# Patient Record
Sex: Male | Born: 1945
Health system: Southern US, Community
[De-identification: ages and names within clinical notes are randomized; demographics above are authoritative.]

## PROBLEM LIST (undated history)

## (undated) DIAGNOSIS — L814 Other melanin hyperpigmentation: Secondary | ICD-10-CM

## (undated) DIAGNOSIS — R51 Headache: Secondary | ICD-10-CM

## (undated) DIAGNOSIS — D126 Benign neoplasm of colon, unspecified: Secondary | ICD-10-CM

## (undated) DIAGNOSIS — K5730 Diverticulosis of large intestine without perforation or abscess without bleeding: Secondary | ICD-10-CM

## (undated) DIAGNOSIS — F341 Dysthymic disorder: Secondary | ICD-10-CM

## (undated) DIAGNOSIS — I10 Essential (primary) hypertension: Secondary | ICD-10-CM

## (undated) DIAGNOSIS — K635 Polyp of colon: Secondary | ICD-10-CM

## (undated) DIAGNOSIS — J40 Bronchitis, not specified as acute or chronic: Secondary | ICD-10-CM

## (undated) DIAGNOSIS — J329 Chronic sinusitis, unspecified: Secondary | ICD-10-CM

## (undated) DIAGNOSIS — R9431 Abnormal electrocardiogram [ECG] [EKG]: Secondary | ICD-10-CM

## (undated) HISTORY — DX: Other melanin hyperpigmentation: L81.4

## (undated) HISTORY — DX: Bronchitis, not specified as acute or chronic: J40

## (undated) HISTORY — DX: Diverticulosis of large intestine without perforation or abscess without bleeding: K57.30

## (undated) HISTORY — DX: Essential (primary) hypertension: I10

## (undated) HISTORY — DX: Headache: R51

## (undated) HISTORY — DX: Polyp of colon: K63.5

## (undated) HISTORY — DX: Dysthymic disorder: F34.1

## (undated) HISTORY — DX: Chronic sinusitis, unspecified: J32.9

## (undated) HISTORY — PX: OTHER SURGICAL HISTORY: SHX169

## (undated) HISTORY — DX: Abnormal electrocardiogram (ECG) (EKG): R94.31

## (undated) HISTORY — DX: Benign neoplasm of colon, unspecified: D12.6

---

## 1999-04-30 ENCOUNTER — Other Ambulatory Visit: Admission: RE | Admit: 1999-04-30 | Discharge: 1999-04-30 | Payer: Self-pay | Admitting: Gastroenterology

## 1999-04-30 ENCOUNTER — Encounter (INDEPENDENT_AMBULATORY_CARE_PROVIDER_SITE_OTHER): Payer: Self-pay

## 2002-07-15 ENCOUNTER — Ambulatory Visit (HOSPITAL_COMMUNITY): Admission: RE | Admit: 2002-07-15 | Discharge: 2002-07-15 | Payer: Self-pay | Admitting: *Deleted

## 2005-07-02 ENCOUNTER — Ambulatory Visit: Payer: Self-pay | Admitting: Pulmonary Disease

## 2005-12-18 ENCOUNTER — Ambulatory Visit: Payer: Self-pay | Admitting: Pulmonary Disease

## 2007-01-22 ENCOUNTER — Ambulatory Visit: Payer: Self-pay | Admitting: Pulmonary Disease

## 2007-01-22 LAB — CONVERTED CEMR LAB
ALT: 25 units/L (ref 0–40)
AST: 23 units/L (ref 0–37)
BUN: 23 mg/dL (ref 6–23)
Basophils Relative: 0.6 % (ref 0.0–1.0)
Bilirubin, Direct: 0.1 mg/dL (ref 0.0–0.3)
Calcium: 9.2 mg/dL (ref 8.4–10.5)
Chloride: 109 meq/L (ref 96–112)
Cholesterol: 173 mg/dL (ref 0–200)
Creatinine, Ser: 1 mg/dL (ref 0.4–1.5)
Eosinophils Absolute: 0.2 10*3/uL (ref 0.0–0.6)
GFR calc Af Amer: 99 mL/min
GFR calc non Af Amer: 82 mL/min
HDL: 42.3 mg/dL (ref >39.0)
Hemoglobin: 14.6 g/dL (ref 13.0–17.0)
Ketones, ur: NEGATIVE mg/dL
MCV: 89.6 fL (ref 78.0–100.0)
Monocytes Relative: 8.5 % (ref 3.0–11.0)
Nitrite: NEGATIVE
PSA: 1.22 ng/mL (ref 0.10–4.00)
Potassium: 4.2 meq/L (ref 3.5–5.1)
Specific Gravity, Urine: 1.015 (ref 1.000–1.03)
TSH: 1.1 microintl units/mL (ref 0.35–5.50)
Total CHOL/HDL Ratio: 4.1
Triglycerides: 107 mg/dL (ref 0–149)
Urobilinogen, UA: 0.2 (ref 0.0–1.0)
WBC: 5.2 10*3/uL (ref 4.5–10.5)
pH: 7 (ref 5.0–8.0)

## 2008-04-27 DIAGNOSIS — F341 Dysthymic disorder: Secondary | ICD-10-CM | POA: Insufficient documentation

## 2008-04-27 DIAGNOSIS — J329 Chronic sinusitis, unspecified: Secondary | ICD-10-CM | POA: Insufficient documentation

## 2008-04-27 DIAGNOSIS — R51 Headache: Secondary | ICD-10-CM | POA: Insufficient documentation

## 2008-04-27 DIAGNOSIS — J40 Bronchitis, not specified as acute or chronic: Secondary | ICD-10-CM | POA: Insufficient documentation

## 2008-04-27 DIAGNOSIS — R519 Headache, unspecified: Secondary | ICD-10-CM | POA: Insufficient documentation

## 2008-04-28 ENCOUNTER — Ambulatory Visit: Payer: Self-pay | Admitting: Pulmonary Disease

## 2008-04-28 DIAGNOSIS — J309 Allergic rhinitis, unspecified: Secondary | ICD-10-CM | POA: Insufficient documentation

## 2008-04-28 DIAGNOSIS — R9431 Abnormal electrocardiogram [ECG] [EKG]: Secondary | ICD-10-CM | POA: Insufficient documentation

## 2008-04-28 DIAGNOSIS — L819 Disorder of pigmentation, unspecified: Secondary | ICD-10-CM | POA: Insufficient documentation

## 2008-04-28 DIAGNOSIS — D126 Benign neoplasm of colon, unspecified: Secondary | ICD-10-CM | POA: Insufficient documentation

## 2008-04-28 DIAGNOSIS — K5730 Diverticulosis of large intestine without perforation or abscess without bleeding: Secondary | ICD-10-CM | POA: Insufficient documentation

## 2008-04-28 DIAGNOSIS — E785 Hyperlipidemia, unspecified: Secondary | ICD-10-CM | POA: Insufficient documentation

## 2008-04-28 LAB — CONVERTED CEMR LAB
ALT: 35 units/L (ref 0–53)
AST: 28 units/L (ref 0–37)
Albumin: 4.4 g/dL (ref 3.5–5.2)
Alkaline Phosphatase: 50 units/L (ref 39–117)
BUN: 20 mg/dL (ref 6–23)
Bacteria, UA: NEGATIVE
Basophils Absolute: 0 10*3/uL (ref 0.0–0.1)
Basophils Relative: 0.7 % (ref 0.0–3.0)
Bilirubin Urine: NEGATIVE
Bilirubin, Direct: 0.1 mg/dL (ref 0.0–0.3)
CO2: 30 meq/L (ref 19–32)
Calcium: 9.4 mg/dL (ref 8.4–10.5)
Chloride: 101 meq/L (ref 96–112)
Cholesterol: 193 mg/dL (ref 0–200)
Creatinine, Ser: 0.8 mg/dL (ref 0.4–1.5)
Crystals: NEGATIVE
Eosinophils Absolute: 0.2 10*3/uL (ref 0.0–0.7)
Eosinophils Relative: 4 % (ref 0.0–5.0)
GFR calc Af Amer: 127 mL/min
GFR calc non Af Amer: 105 mL/min
Glucose, Bld: 115 mg/dL — ABNORMAL HIGH (ref 70–99)
HCT: 46.6 % (ref 39.0–52.0)
HDL: 48.4 mg/dL (ref >39.0)
Hemoglobin, Urine: NEGATIVE
Hemoglobin: 16.1 g/dL (ref 13.0–17.0)
Ketones, ur: NEGATIVE mg/dL
LDL Cholesterol: 124 mg/dL — ABNORMAL HIGH (ref 0–99)
Leukocytes, UA: NEGATIVE
Lymphocytes Relative: 45.5 % (ref 12.0–46.0)
MCHC: 34.6 g/dL (ref 30.0–36.0)
MCV: 92.2 fL (ref 78.0–100.0)
Monocytes Absolute: 0.4 10*3/uL (ref 0.1–1.0)
Monocytes Relative: 7.5 % (ref 3.0–12.0)
Mucus, UA: NEGATIVE
Neutro Abs: 2.3 10*3/uL (ref 1.4–7.7)
Neutrophils Relative %: 42.3 % — ABNORMAL LOW (ref 43.0–77.0)
Nitrite: NEGATIVE
PSA: 1.5 ng/mL (ref 0.10–4.00)
Platelets: 227 10*3/uL (ref 150–400)
Potassium: 4.5 meq/L (ref 3.5–5.1)
RBC / HPF: NONE SEEN
RBC: 5.06 M/uL (ref 4.22–5.81)
RDW: 12.4 % (ref 11.5–14.6)
Sodium: 137 meq/L (ref 135–145)
Specific Gravity, Urine: 1.01 (ref 1.000–1.03)
Squamous Epithelial / LPF: NEGATIVE /lpf
TSH: 1.13 microintl units/mL (ref 0.35–5.50)
Total Bilirubin: 1 mg/dL (ref 0.3–1.2)
Total CHOL/HDL Ratio: 4
Total Protein, Urine: NEGATIVE mg/dL
Total Protein: 6.8 g/dL (ref 6.0–8.3)
Triglycerides: 105 mg/dL (ref 0–149)
Urine Glucose: NEGATIVE mg/dL
Urobilinogen, UA: 0.2 (ref 0.0–1.0)
VLDL: 21 mg/dL (ref 0–40)
WBC, UA: NONE SEEN cells/hpf
WBC: 5.5 10*3/uL (ref 4.5–10.5)
pH: 7.5 (ref 5.0–8.0)

## 2008-05-12 ENCOUNTER — Ambulatory Visit (HOSPITAL_COMMUNITY): Admission: RE | Admit: 2008-05-12 | Discharge: 2008-05-12 | Payer: Self-pay | Admitting: *Deleted

## 2009-08-07 IMAGING — CR DG CHEST 2V
2 series · 2 of 2 positions shown · non-contrast
Comparison: PA and lateral chest 01/22/2007.

CLINICAL DATA: Wellness check.

CHEST - 2 VIEW

[view not recorded (1 of 2)]
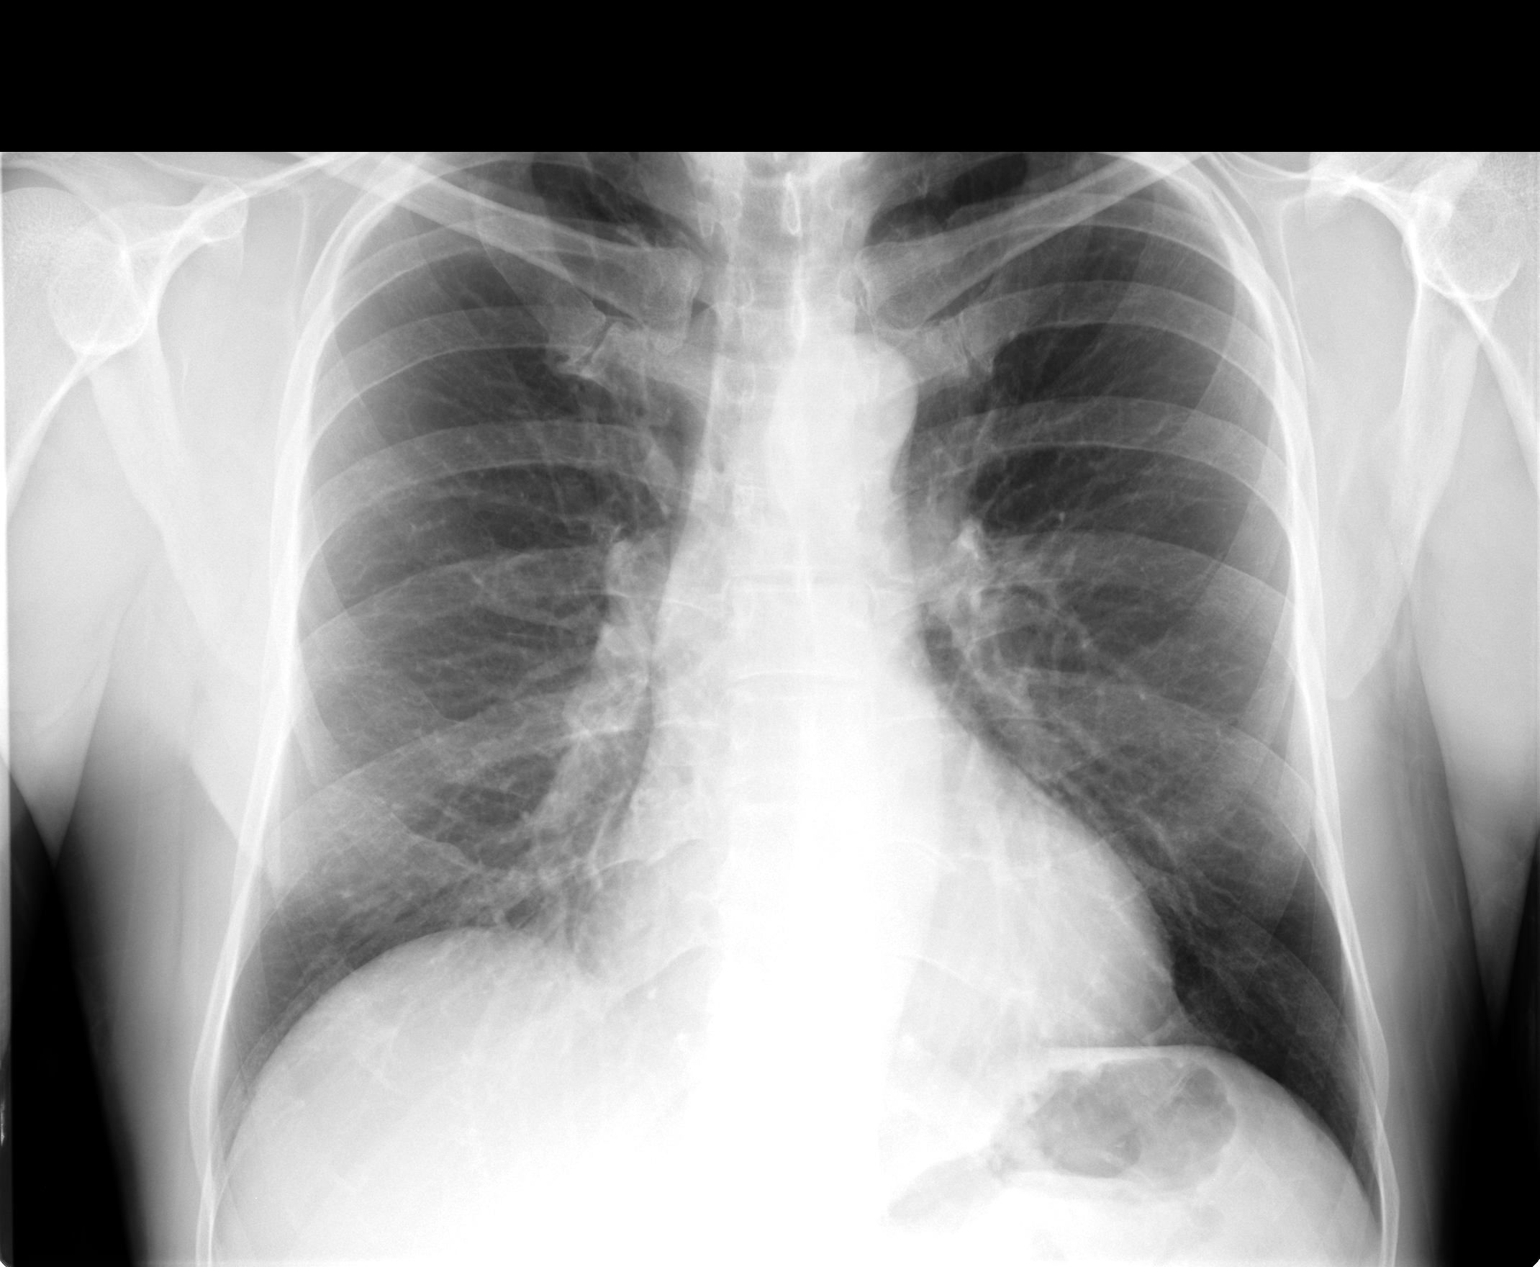

[view not recorded (2 of 2)]
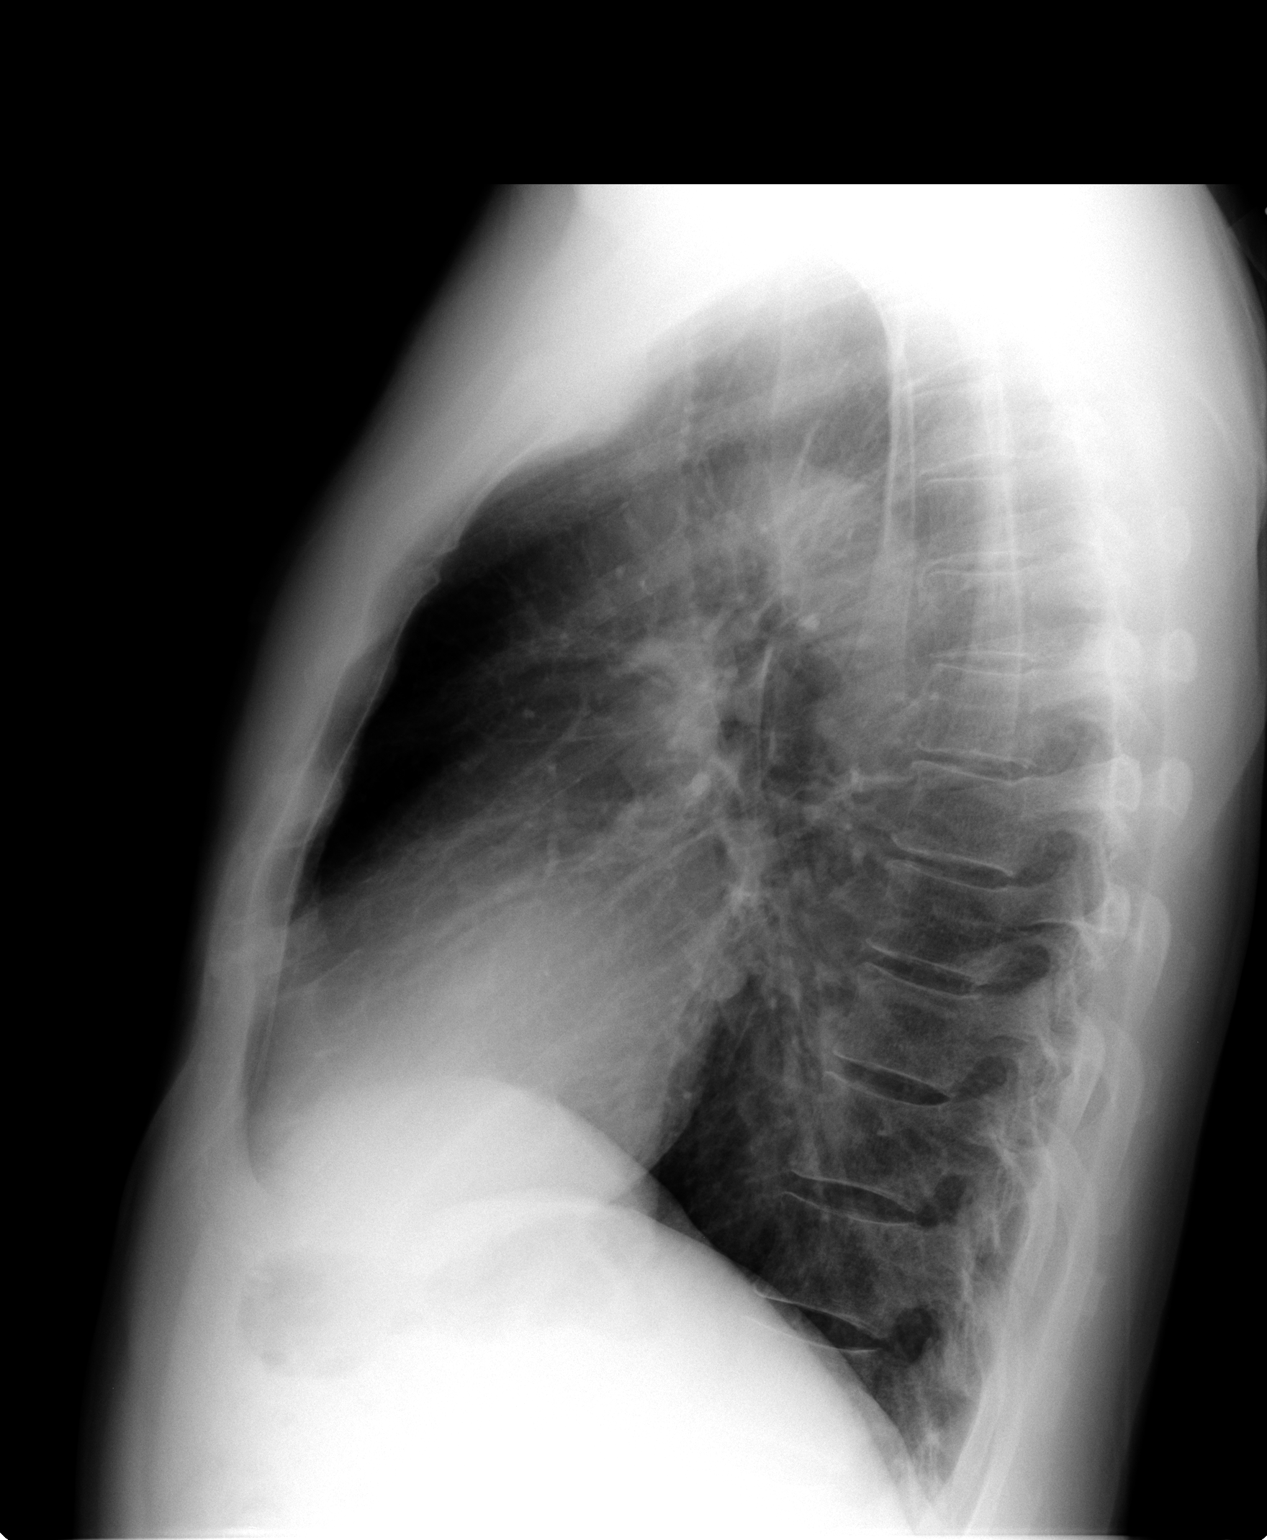

[2 of 2 positions shown; findings below may reference images not displayed]

FINDINGS: Lungs are clear.  Heart size is normal.  There is no
pleural effusion or focal bony abnormality.
IMPRESSION: No acute disease.

## 2009-12-08 ENCOUNTER — Ambulatory Visit: Payer: Self-pay | Admitting: Pulmonary Disease

## 2009-12-09 LAB — CONVERTED CEMR LAB
AST: 27 units/L (ref 0–37)
Alkaline Phosphatase: 53 units/L (ref 39–117)
BUN: 20 mg/dL (ref 6–23)
Basophils Relative: 0.8 % (ref 0.0–3.0)
Bilirubin Urine: NEGATIVE
CO2: 31 meq/L (ref 19–32)
Calcium: 9.6 mg/dL (ref 8.4–10.5)
Chloride: 107 meq/L (ref 96–112)
Creatinine, Ser: 0.8 mg/dL (ref 0.4–1.5)
Direct LDL: 125 mg/dL
Eosinophils Absolute: 0.1 10*3/uL (ref 0.0–0.7)
Eosinophils Relative: 2.5 % (ref 0.0–5.0)
HCT: 44.4 % (ref 39.0–52.0)
Hemoglobin, Urine: NEGATIVE
Leukocytes, UA: NEGATIVE
MCHC: 32.6 g/dL (ref 30.0–36.0)
MCV: 94.8 fL (ref 78.0–100.0)
Monocytes Relative: 8.6 % (ref 3.0–12.0)
Neutro Abs: 1.6 10*3/uL (ref 1.4–7.7)
Neutrophils Relative %: 35.4 % — ABNORMAL LOW (ref 43.0–77.0)
Nitrite: NEGATIVE
PSA: 2.23 ng/mL (ref 0.10–4.00)
RBC: 4.68 M/uL (ref 4.22–5.81)
Specific Gravity, Urine: 1.02 (ref 1.000–1.030)
Total CHOL/HDL Ratio: 3
Total Protein, Urine: NEGATIVE mg/dL
Total Protein: 7.8 g/dL (ref 6.0–8.3)
Urobilinogen, UA: 0.2 (ref 0.0–1.0)
VLDL: 10.8 mg/dL (ref 0.0–40.0)
WBC: 4.6 10*3/uL (ref 4.5–10.5)
pH: 7.5 (ref 5.0–8.0)

## 2010-09-06 ENCOUNTER — Ambulatory Visit: Payer: Self-pay | Admitting: Pulmonary Disease

## 2010-09-06 DIAGNOSIS — I10 Essential (primary) hypertension: Secondary | ICD-10-CM | POA: Insufficient documentation

## 2010-10-30 NOTE — Assessment & Plan Note (Signed)
Summary: Acute NP office visit - hypertension   CC:  HBP "off and on" x1year w/ "mild" HA.  denies changes in vision.  History of Present Illness: 65 y    ~  Jul09:  he enjoys excellent general medical health and runs marathons- 3 this year... he is on no regular meds other than an Aspirin daily... his only complaint is an unusual episodic "wooziness" that occurs while [place] 70[place] the interstate- assoc w/ a globus-like sensation in his throat... drinking some water may help the sensation, and if he stops and lets his wife drive it all resolves... I offered him some Klonopin to try for these symptoms but he is reluctant to start new meds so he will just watch the symptoms and call Prn...   ~  December 08, 2009:  he remains in excellent medical health- still running regularly & working out at [employer]... no intercurrent medical problems, feeling well w/o new complaints or concerns...  he requests refill Levaquin for the occas sinus infection, and is due for a TDaP vaccination today...  September 06, 2010 --Presents for work in visit. Complains that blood pressure has been trending up. On average running 140-150/90-100. He is very active and runs marathons. Does take NSAIDS on average 4 tabs a week. Has a  hx of HTN in parents and sister.  Denies chest pain, dyspnea, orthopnea, hemoptysis, fever, n/v/d, edema, headache,recent travel , visual/speech changes,  Denies any decongestants.   Medications Prior to Update: 1)  Aspirin Adult Low Strength 81 Mg  Tbec (Aspirin) .... Take 1 By Mouth Once Daily 2)  Levaquin 500 Mg Tabs (Levofloxacin) .... Take 1 Tab By Mouth Once Daily...  Current Medications (verified): 1)  Aspirin Adult Low Strength 81 Mg  Tbec (Aspirin) .... Take 1 By Mouth Once Daily  Allergies (verified): No Known Drug Allergies  Past History:  Past Medical History: Last updated: 12/08/2009  ALLERGIC RHINITIS (ICD-477.9) Hx of SINUSITIS (ICD-473.9) Hx of BRONCHITIS (ICD-490) Hx  of ABNORMAL ELECTROCARDIOGRAM (ICD-794.31) HYPERCHOLESTEROLEMIA, BORDERLINE (ICD-272.4) DIVERTICULOSIS OF COLON (ICD-562.10) COLONIC POLYPS (ICD-211.3) Hx of HEADACHE (ICD-784.0) ANXIETY DEPRESSION (ICD-300.4) Hx of LENTIGO (ICD-709.09)  Family History: Last updated: 04/28/2008 mother alive age 85 father deceased age 18  hx of heart problems 1 sister alive age 16  Social History: Last updated: 09/06/2010 retired quit smoking in 1979 etoh--2 drinks per day married 1 child declines flu shot 09-06-10  Risk Factors: Smoking Status: quit (04/27/2008)  Social History: retired quit smoking in 1979 etoh--2 drinks per day married 1 child declines flu shot 09-06-10  Review of Systems      See HPI  Vital Signs:  Patient profile:   65 year old male Height:      69 inches Weight:      164 pounds BMI:     24.31 O2 Sat:      100 % on Room air Temp:     96.8 degrees F oral Pulse rate:   71 / minute BP sitting:   162 / 104  (right arm) Cuff size:   regular  Vitals Entered By: Boone Master CNA/MA (September 06, 2010 10:59 AM)  O2 Flow:  Room air CC: HBP "off and on" x1year w/ "mild" HA.  denies changes in vision Is Patient Diabetic? No Comments Medications reviewed with patient Daytime contact number verified with patient. Boone Master CNA/MA  September 06, 2010 10:59 AM    Physical Exam  Additional Exam:  WD, WN, 65 y/o WM  in NAD... GENERAL:  Alert & oriented; pleasant & cooperative... HEENT:  [place]/AT, EOM-wnl, PERRLA, Fundi-benign, EACs-clear, TMs-wnl, NOSE-clear, THROAT-clear & wnl. NECK:  Supple w/ full ROM; no JVD; normal carotid impulses w/o bruits; no thyromegaly or nodules palpated; no lymphadenopathy. CHEST:  Clear to P & A; without wheezes/ rales/ or rhonchi heard... HEART:  Regular Rhythm; without murmurs/ rubs/ or gallops detected... ABDOMEN:  Soft & nontender; normal bowel sounds; no organomegaly or masses palpated... EXT: without deformities or arthritic  changes; no varicose veins/ venous insuffic/ or edema... NEURO:  CN's intact; motor testing normal; sensory testing normal; gait normal & balance OK. DERM:  No lesions noted; no rash etc...    Impression & Recommendations:  Problem # 1:  HYPERTENSION, BENIGN ESSENTIAL (ICD-401.1)  Unable to control b/p with diet and exercise.  advised on decreasing NSAIDS use. and to avoid decongestants, pt verbalized understanding.  We discussed several options for b/p control, he would like to try norvasc-(wife is taking).  Advised on return visit in 6 weeks to follow up however he wants to wait until CPX in 3 months Dr. Kriste Basque  advised on b/p log and to call if b/p is not improving.  Plan:  Amlodipine 5mg  once daily  Low salt diet.  Exercise as tolerated  Check blood pressure 3 times week. Keep log and bring next office visit.  follow up Dr. Kriste Basque in 3 months for physical after 12/10/10.  Please contact office for sooner follow up if symptoms do not improve or worsen  Call blood pressure <90 or >160.   His updated medication list for this problem includes:    Amlodipine Besylate 5 Mg Tabs (Amlodipine besylate) .Marland Kitchen... 1 by mouth once daily  Orders: Est. Patient Level IV (25852)  Medications Added to Medication List This Visit: 1)  Amlodipine Besylate 5 Mg Tabs (Amlodipine besylate) .Marland Kitchen.. 1 by mouth once daily  Complete Medication List: 1)  Aspirin Adult Low Strength 81 Mg Tbec (Aspirin) .... Take 1 by mouth once daily 2)  Amlodipine Besylate 5 Mg Tabs (Amlodipine besylate) .Marland Kitchen.. 1 by mouth once daily  Patient Instructions: 1)  Amlodipine 5mg  once daily  2)  Low salt diet.  3)  Exercise as tolerated  4)  Check blood pressure 3 times week. Keep log and bring next office visit.  5)  follow up Dr. Kriste Basque in 3 months for physical after 12/10/10.  6)  Please contact office for sooner follow up if symptoms do not improve or worsen  7)  Call blood pressure <90 or >160.  Prescriptions: AMLODIPINE  BESYLATE 5 MG TABS (AMLODIPINE BESYLATE) 1 by mouth once daily  #30 x 5   Entered and Authorized by:   Rubye Oaks NP   Signed by:   Tammy Parrett NP on 09/06/2010   Method used:   Electronically to        [id number] (retail)       [address]       [place], [place]  [address]       Ph: [phone] or [phone]       Fax: [phone]   RxID:   [id number]

## 2010-10-30 NOTE — Assessment & Plan Note (Signed)
Summary: cpx/fasting/apc   CC:  20 month ROV & CPX....  History of Present Illness: 65 y/o WM here for a follow up visit and CPX...    ~  Jul09:  he enjoys excellent general medical health and runs marathons- 3 this year... he is on no regular meds other than an Aspirin daily... his only complaint is an unusual episodic "wooziness" that occurs while driving 70mph on the interstate- assoc w/ a globus-like sensation in his throat... drinking some water may help the sensation, and if he stops and lets his wife drive it all resolves... I offered him some Klonopin to try for these symptoms but he is reluctant to start new meds so he will just watch the symptoms and call Prn...   ~  December 08, 2009:  he remains in excellent medical health- still running regularly & working out at [employer]... no intercurrent medical problems, feeling well w/o new complaints or concerns...  he requests refill Levaquin for the occas sinus infection, and is due for a TDaP vaccination today...    Current Problem List:  ALLERGIC RHINITIS (ICD-477.9)  Hx of SINUSITIS (ICD-473.9) - Amoxiciilin doesn't work for him & he uses Levaquin.  Hx of BRONCHITIS (ICD-490) - he likes to keep Rx for Levaquin on hand for bronchitic infections...  ~  CXR 3/11 showed clear lungs, NAD...  Hx of ABNORMAL ELECTROCARDIOGRAM (ICD-794.31) - his baseline EKG's have shown benign early repolarization changes and increased voltage... no change over the last 42yrs... 2DEcho 1999 showed normal heart chambers & valves- trivial MR, EF=60%... as noted he exercises regularly & still runs marathons...  ~  EKG 3/11 showed NSR, rate 62/min, increased voltage and early repol, no acute changes... NOTE:  BP 138/88 after rest & he is recommended to check BP at home & call if >140s/ 90...  HYPERCHOLESTEROLEMIA, BORDERLINE (ICD-272.4) - on diet alone, normal weight, good athlete...  ~  FLP 4/08 showed TChol 173, TG 107, HDL 42, LDL 109  ~  FLP 7/09 showed  TChol 193, TG 105, HDL 48, LDL 124  ~  FLP 3/11 showed TChol 201, TG 54, HDL 69, LDL 125  DIVERTICULOSIS OF COLON (ICD-562.10) & COLONIC POLYPS (ICD-211.3) - he is friends w/ DrGOrr and colonoscopy 10/03 by him was neg x for sm hems... several 2-53mm polyps removed in 2000 by DrKaplan who also noted a few divertics... path= 3 hyperpl polyps & one tubular adenoma... f/u colon was done by DrOrr 8/09 before he moved to [facility] according to the pt- we don't have copy, pt reports that it was OK...   Hx of HEADACHE (ICD-784.0)  Hx of ANXIETY DEPRESSION (ICD-300.4) - prev treated w/ Paxil yrs ago... no recurrent problems...  Hx of LENTIGO (ICD-709.09) - Derm f/u DrTafeen...  Health Maintenance:  ~  GI:  colon per DrOrr 8/09 reported by the pt to be "OK"... f/u due 22yrs due to hx polyps.  ~  GU:  CPX 3/11 showed DRE= neg, & PSA= 2.23...  ~  Immunization:  he refuses Flu vaccines;  had PNEUMOVAX in 2000 at age 9;  had Tetanus shot 2000 & we will give TDaP today 12/08/09.    Allergies (verified): No Known Drug Allergies  Comments:  Nurse/Medical Assistant: The patient's medications and allergies were reviewed with the patient and were updated in the Medication and Allergy Lists.  Past History:  Past Medical History:  ALLERGIC RHINITIS (ICD-477.9) Hx of SINUSITIS (ICD-473.9) Hx of BRONCHITIS (ICD-490) Hx of ABNORMAL ELECTROCARDIOGRAM (ICD-794.31)  HYPERCHOLESTEROLEMIA, BORDERLINE (ICD-272.4) DIVERTICULOSIS OF COLON (ICD-562.10) COLONIC POLYPS (ICD-211.3) Hx of HEADACHE (ICD-784.0) ANXIETY DEPRESSION (ICD-300.4) Hx of LENTIGO (ICD-709.09)  Family History: Reviewed history from 04/28/2008 and no changes required. mother alive age 65 father deceased age 50  hx of heart problems 1 sister alive age 30  Social History: Reviewed history from 04/28/2008 and no changes required. retired quit smoking in 1979 etoh--2 drinks per day married 1 child  Review of Systems  The patient  denies fever, chills, sweats, anorexia, fatigue, weakness, malaise, weight loss, sleep disorder, blurring, diplopia, eye irritation, eye discharge, vision loss, eye pain, photophobia, earache, ear discharge, tinnitus, decreased hearing, nasal congestion, nosebleeds, sore throat, hoarseness, chest pain, palpitations, syncope, dyspnea on exertion, orthopnea, PND, peripheral edema, cough, dyspnea at rest, excessive sputum, hemoptysis, wheezing, pleurisy, nausea, vomiting, diarrhea, constipation, change in bowel habits, abdominal pain, melena, hematochezia, jaundice, gas/bloating, indigestion/heartburn, dysphagia, odynophagia, dysuria, hematuria, urinary frequency, urinary hesitancy, nocturia, incontinence, back pain, joint pain, joint swelling, muscle cramps, muscle weakness, stiffness, arthritis, sciatica, restless legs, leg pain at night, leg pain with exertion, rash, itching, dryness, suspicious lesions, paralysis, paresthesias, seizures, tremors, vertigo, transient blindness, frequent falls, frequent headaches, difficulty walking, depression, anxiety, memory loss, confusion, cold intolerance, heat intolerance, polydipsia, polyphagia, polyuria, unusual weight change, abnormal bruising, bleeding, enlarged lymph nodes, urticaria, allergic rash, hay fever, and recurrent infections.    Vital Signs:  Patient profile:   65 year old male Height:      69 inches Weight:      163.50 pounds BMI:     24.23 O2 Sat:      100 % on Room air Temp:     97.4 degrees F oral Pulse rate:   62 / minute BP sitting:   138 / 88  (right arm) Cuff size:   regular  Vitals Entered By: Randell Loop CMA (December 08, 2009 8:48 AM)  O2 Sat at Rest %:  100 O2 Flow:  Room air CC: 20 month ROV & CPX... Is Patient Diabetic? No Pain Assessment Patient in pain? no      Comments meds updated today   Physical Exam  Additional Exam:  WD, WN, 65 y/o WM in NAD... GENERAL:  Alert & oriented; pleasant & cooperative... HEENT:  [place]/AT,  EOM-wnl, PERRLA, Fundi-benign, EACs-clear, TMs-wnl, NOSE-clear, THROAT-clear & wnl. NECK:  Supple w/ full ROM; no JVD; normal carotid impulses w/o bruits; no thyromegaly or nodules palpated; no lymphadenopathy. CHEST:  Clear to P & A; without wheezes/ rales/ or rhonchi heard... HEART:  Regular Rhythm; without murmurs/ rubs/ or gallops detected... ABDOMEN:  Soft & nontender; normal bowel sounds; no organomegaly or masses palpated... EXT: without deformities or arthritic changes; no varicose veins/ venous insuffic/ or edema... NEURO:  CN's intact; motor testing normal; sensory testing normal; gait normal & balance OK. DERM:  No lesions noted; no rash etc...    CXR  Procedure date:  12/08/2009  Findings:      CHEST - 2 VIEW Comparison: 04/28/2008   Findings: Trachea is midline.  Heart is at the upper limits of normal in size.  Lungs are clear.  No pleural fluid.   IMPRESSION: No acute findings.   Read By:  Reyes Ivan.,  M.D.   EKG  Procedure date:  12/08/2009  Findings:      Normal sinus rhythm with rate of:  62/min... Tracing shows incr voltage and early repol changes...  SN   MISC. Report  Procedure date:  12/08/2009  Findings:  Lipid Panel (LIPID)   Cholesterol          [H]  201 mg/dL                   4-098   Triglycerides             54.0 mg/dL                  1.1-914.7   HDL                       82.95 mg/dL                 >62.13   Cholesterol LDL - Direct                             125.0 mg/dL  BMP (METABOL)   Sodium                    142 mEq/L                   135-145   Potassium                 4.6 mEq/L                   3.5-5.1   Chloride                  107 mEq/L                   96-112   Carbon Dioxide            31 mEq/L                    19-32   Glucose              [H]  104 mg/dL                   08-65   BUN                       20 mg/dL                    7-84   Creatinine                0.8 mg/dL                   6.9-6.2    Calcium                   9.6 mg/dL                   9.5-28.4   GFR                       104.32 mL/min               >60  Hepatic/Liver Function Panel (HEPATIC)   Total Bilirubin           0.8 mg/dL                   1.3-2.4   Direct Bilirubin          0.2 mg/dL  0.0-0.3   Alkaline Phosphatase      53 U/L                      39-117   AST                       27 U/L                      0-37   ALT                       25 U/L                      0-53   Total Protein             7.8 g/dL                    3.8-7.5   Albumin                   4.5 g/dL                    6.4-3.3  Comments:      CBC Platelet w/Diff (CBCD)   White Cell Count          4.6 K/uL                    4.5-10.5   Red Cell Count            4.68 Mil/uL                 4.22-5.81   Hemoglobin                14.5 g/dL                   29.5-18.8   Hematocrit                44.4 %                      39.0-52.0   MCV                       94.8 fl                     78.0-100.0   Platelet Count            230.0 K/uL                  150.0-400.0   Neutrophil %         [L]  35.4 %                      43.0-77.0   Lymphocyte %         [H]  52.7 %                      12.0-46.0   Monocyte %                8.6 %                       3.0-12.0   Eosinophils%              2.5 %  0.0-5.0   Basophils %               0.8 %                       0.0-3.0  TSH (TSH)   FastTSH                   1.34 uIU/mL                 0.35-5.50  Prostate Specific Antigen (PSA)   PSA-Hyb                   2.23 ng/mL                  0.10-4.00  UDip w/Micro (URINE)   Color                     Yellow   Clarity                   CLEAR                       Clear   Specific Gravity          1.020                       1.000 - 1.030   Urine Ph                  7.5                         5.0-8.0   Protein                   NEGATIVE                    Negative   Urine Glucose             NEGATIVE                     Negative   Ketones                   NEGATIVE                    Negative   Urine Bilirubin           NEGATIVE      Blood                     NEGATIVE                    Negative   Urobilinogen              0.2                         0.0 - 1.0   Leukocyte Esterace        NEGATIVE                    Negative   Nitrite                   NEGATIVE                    Negative   Urine WBC  0-2/hpf                     0-2/hpf   Urine Epith               Rare(0-4/hpf)               Rare(0-4/hpf)   Urine Bacteria            Rare(<10/hpf)     Impression & Recommendations:  Problem # 1:  PHYSICAL EXAMINATION (ICD-V70.0)  Orders: EKG w/ Interpretation (93000) T-2 View CXR (71020TC) TLB-Lipid Panel (80061-LIPID) TLB-BMP (Basic Metabolic Panel-BMET) (80048-METABOL) TLB-Hepatic/Liver Function Pnl (80076-HEPATIC) TLB-CBC Platelet - w/Differential (85025-CBCD) TLB-TSH (Thyroid Stimulating Hormone) (84443-TSH) TLB-PSA (Prostate Specific Antigen) (84153-PSA) TLB-Udip w/ Micro (81001-URINE)  Problem # 2:  Hx of BRONCHITIS (ICD-490) He requests Rx for Levaquin to keep on hand...  His updated medication list for this problem includes:    Levaquin 500 Mg Tabs (Levofloxacin) .Marland Kitchen... Take 1 tab by mouth once daily...  Problem # 3:  Hx of ABNORMAL ELECTROCARDIOGRAM (ICD-794.31) He is very active, runs marathons, totally asymptomatic... Abn EKG- neg 2DEcho in 1999... we discussed monitoring BP at home and call if >140s/90...  Problem # 4:  HYPERCHOLESTEROLEMIA, BORDERLINE (ICD-272.4) He controls w/ diet alone, he is in good shape but not at the goal... he does not want med Rx and will continue w/ diet/ exerc...  Problem # 5:  COLONIC POLYPS (ICD-211.3) He is up to date on screening...  Problem # 6:  OTHER MEDICAL PROBLEMS AS NOTED>>> He refuses Flu vaccines... needs TDAP today.  Complete Medication List: 1)  Aspirin Adult Low Strength 81 Mg Tbec (Aspirin) .... Take 1 by mouth  once daily 2)  Levaquin 500 Mg Tabs (Levofloxacin) .... Take 1 tab by mouth once daily...  Other Orders: Prescription Created Electronically [phone]) Tdap => 66yrs IM (30865) Admin 1st Vaccine (78469)  Patient Instructions: 1)  Today we updated your med list- see below.... 2)  We refilled your Levaquin to use as needed for sinus infections... 3)  Today we did your follow up CXR, EKG, & fasting blood work... please call the "phone tree" in a few days for your lab results.Marland KitchenMarland Kitchen 4)  Call for any problems.Marland KitchenMarland Kitchen 5)  Please schedule a follow-up appointment in 1 year. Prescriptions: LEVAQUIN 500 MG TABS (LEVOFLOXACIN) take 1 tab by mouth once daily...  #7 x 2   Entered and Authorized by:   Michele Mcalpine MD   Signed by:   Michele Mcalpine MD on 12/08/2009   Method used:   Print then Mail to Patient   RxID:   [id number]    [facility] ECG  ID: [id number] Patient: JAELIN, FACKLER DOB: [birth year] Age: 65 Years Old Sex: Male Race: White Physician: Noelly Lasseigne Technician: Randell Loop CMA Height: 69 Weight: 163.50 Status: Unconfirmed Past Medical History:   ALLERGIC RHINITIS (ICD-477.9) Hx of SINUSITIS (ICD-473.9) Hx of BRONCHITIS (ICD-490) Hx of ABNORMAL ELECTROCARDIOGRAM (ICD-794.31) HYPERCHOLESTEROLEMIA, BORDERLINE (ICD-272.4) DIVERTICULOSIS OF COLON (ICD-562.10) COLONIC POLYPS (ICD-211.3) Hx of HEADACHE (ICD-784.0) ANXIETY DEPRESSION (ICD-300.4) Hx of LENTIGO (ICD-709.09)   Recorded: 12/08/2009 09:08 AM P/PR: 122 ms / 213 ms - Heart rate (maximum exercise) QRS: 87 QT/QTc/QTd: 402 ms / 406 ms / 71 ms - Heart rate (maximum exercise)  P/QRS/T axis: 58 deg / 57 deg / 63 deg - Heart rate (maximum exercise)  Heartrate: 62 bpm  Interpretation:  Normal sinus rhythm with rate of:  62/min... Tracing shows incr voltage and early repol  changes...  SN    Immunizations Administered:  Tetanus Vaccine:    Vaccine Type: Tdap    Site: right deltoid    Mfr: boostrix    Dose: 0.5  ml    Route: IM    Given by: Randell Loop CMA    Exp. Date: 03/31/2011    Lot #: [id number]    VIS given: 08/18/07 version given December 08, 2009.

## 2010-11-15 ENCOUNTER — Ambulatory Visit (INDEPENDENT_AMBULATORY_CARE_PROVIDER_SITE_OTHER): Payer: BC Managed Care – PPO | Admitting: Adult Health

## 2010-11-15 ENCOUNTER — Encounter: Payer: Self-pay | Admitting: Adult Health

## 2010-11-15 DIAGNOSIS — I10 Essential (primary) hypertension: Secondary | ICD-10-CM

## 2010-11-21 NOTE — Assessment & Plan Note (Signed)
Summary: NP follow up - HTN   CC:  follow up for HTN - states norvasc is not working.  BP have been >140/90 x11month.  History of Present Illness: 9 y    ~  Jul09:  he enjoys excellent general medical health and runs marathons- 3 this year... he is on no regular meds other than an Aspirin daily... his only complaint is an unusual episodic "wooziness" that occurs while [place] 70[place] on the interstate- assoc w/ a globus-like sensation in his throat... drinking some water may help the sensation, and if he stops and lets his wife drive it all resolves... I offered him some Klonopin to try for these symptoms but he is reluctant to start new meds so he will just watch the symptoms and call Prn...   ~  December 08, 2009:  he remains in excellent medical health- still running regularly & working out at [employer]... no intercurrent medical problems, feeling well w/o new complaints or concerns...  he requests refill Levaquin for the occas sinus infection, and is due for a TDaP vaccination today...  September 06, 2010 --Presents for work in visit. Complains that blood pressure has been trending up. On average running 140-150/90-100. He is very active and runs marathons. Does take NSAIDS on average 4 tabs a week. Has a  hx of HTN in parents and sister.   November 15, 2010 --Presents for follow up for blood pressure.Seen 2months ago for elevated blood presure. despite being active. Last visit was started on amlodipine 5mg  .B/p has trended down but stilll averages 130-150/80-100. Pt had requested norvasc last ov b/c wife takes.We discussed several options for blood pressure reduction and med managmement. For now will increase norvasc to max dose of 10mg  .  Denies chest pain, dyspnea, orthopnea, hemoptysis, fever, n/v/d, edema, headache, visual /speech changes or ext weakness   Medications Prior to Update: 1)  Aspirin Adult Low Strength 81 Mg  Tbec (Aspirin) .... Take 1 By Mouth Once Daily 2)  Amlodipine Besylate 5 Mg  Tabs (Amlodipine Besylate) .Marland Kitchen.. 1 By Mouth Once Daily  Current Medications (verified): 1)  Aspirin Adult Low Strength 81 Mg  Tbec (Aspirin) .... Take 1 By Mouth Once Daily 2)  Amlodipine Besylate 5 Mg Tabs (Amlodipine Besylate) .Marland Kitchen.. 1 By Mouth Once Daily 3)  Unisom 25 Mg Tabs (Doxylamine Succinate (Sleep)) .... Take 1 Tab By Mouth At Bedtime As Needed  Allergies (verified): No Known Drug Allergies  Past History:  Past Medical History: Last updated: 12/08/2009  ALLERGIC RHINITIS (ICD-477.9) Hx of SINUSITIS (ICD-473.9) Hx of BRONCHITIS (ICD-490) Hx of ABNORMAL ELECTROCARDIOGRAM (ICD-794.31) HYPERCHOLESTEROLEMIA, BORDERLINE (ICD-272.4) DIVERTICULOSIS OF COLON (ICD-562.10) COLONIC POLYPS (ICD-211.3) Hx of HEADACHE (ICD-784.0) ANXIETY DEPRESSION (ICD-300.4) Hx of LENTIGO (ICD-709.09)  Family History: Last updated: 04/28/2008 mother alive age 1 father deceased age 49  hx of heart problems 1 sister alive age 6  Social History: Last updated: 09/06/2010 retired quit smoking in 1979 etoh--2 drinks per day married 1 child declines flu shot 09-06-10  Risk Factors: Smoking Status: quit (04/27/2008)  Review of Systems      See HPI  Vital Signs:  Patient profile:   65 year old male Height:      69 inches Weight:      165 pounds BMI:     24.45 O2 Sat:      100 % on Room air Temp:     97.0 degrees F oral Pulse rate:   76 / minute BP sitting:   142 /  90  (left arm) Cuff size:   regular  Vitals Entered By: Boone Master CNA/MA (November 15, 2010 9:04 AM)  O2 Flow:  Room air CC: follow up for HTN - states norvasc is not working.  BP have been >140/90 x11month Is Patient Diabetic? No Comments Medications reviewed with patient Daytime contact number verified with patient. Boone Master CNA/MA  November 15, 2010 9:04 AM    Physical Exam  Additional Exam:  WD, WN, 65 y/o WM in NAD... GENERAL:  Alert & oriented; pleasant & cooperative... HEENT:  [place]/AT,  PERRLA,  Fundi-benign, EACs-clear, TMs-wnl, NOSE-clear, THROAT-clear & wnl. NECK:  Supple w/ full ROM; no JVD; normal carotid impulses w/o bruits; no thyromegaly or nodules palpated; no lymphadenopathy. CHEST:  Clear to P & A; without wheezes/ rales/ or rhonchi heard... HEART:  Regular Rhythm; without murmurs/ rubs/ or gallops detected... ABDOMEN:  Soft & nontender; normal bowel sounds; no organomegaly or masses palpated... EXT: without deformities or arthritic changes; no varicose veins/ venous insuffic/ or edema... NEURO:  gait normal & balance OK., no focal deficits noted  DERM:  No lesions noted; no rash etc...    Impression & Recommendations:  Problem # 1:  HYPERTENSION, BENIGN ESSENTIAL (ICD-401.1) Not optimally controlled on rx  advised on lifestyle modification will get labs on return for CPX next month  Plan:  Increase Amlodipine 10mg  once daily  Low salt diet.  Exercise as tolerated  Check blood pressure 3 times week. Keep log and bring next office visit.  follow up Dr. Kriste Basque in next  month Please contact office for sooner follow up if symptoms do not improve or worsen  Call blood pressure <90 or >160.  His updated medication list for this problem includes:    Amlodipine Besylate 10 Mg Tabs (Amlodipine besylate) .Marland Kitchen... 1 by mouth once daily  Orders: Est. Patient Level III (10272)  Medications Added to Medication List This Visit: 1)  Amlodipine Besylate 10 Mg Tabs (Amlodipine besylate) .Marland Kitchen.. 1 by mouth once daily 2)  Unisom 25 Mg Tabs (Doxylamine succinate (sleep)) .... Take 1 tab by mouth at bedtime as needed  Patient Instructions: 1)  Increase Amlodipine 10mg  once daily  2)  Low salt diet.  3)  Exercise as tolerated  4)  Check blood pressure 3 times week. Keep log and bring next office visit.  5)  follow up Dr. Kriste Basque in next  month 6)  Please contact office for sooner follow up if symptoms do not improve or worsen  7)  Call blood pressure <90 or >160.   Prescriptions: AMLODIPINE BESYLATE 10 MG TABS (AMLODIPINE BESYLATE) 1 by mouth once daily  #30 x 5   Entered and Authorized by:   Rubye Oaks NP   Signed by:   Arista Kettlewell NP on 11/15/2010   Method used:   Electronically to        [id number] (retail)       [address]       [place], [place]  [address]       Ph: [phone] or [phone]       Fax: [phone]   RxID:   [id number]

## 2010-12-25 ENCOUNTER — Encounter: Payer: Self-pay | Admitting: Pulmonary Disease

## 2010-12-28 ENCOUNTER — Other Ambulatory Visit (INDEPENDENT_AMBULATORY_CARE_PROVIDER_SITE_OTHER): Payer: BC Managed Care – PPO

## 2010-12-28 ENCOUNTER — Other Ambulatory Visit (INDEPENDENT_AMBULATORY_CARE_PROVIDER_SITE_OTHER): Payer: BC Managed Care – PPO | Admitting: Pulmonary Disease

## 2010-12-28 ENCOUNTER — Encounter: Payer: Self-pay | Admitting: Pulmonary Disease

## 2010-12-28 ENCOUNTER — Ambulatory Visit (INDEPENDENT_AMBULATORY_CARE_PROVIDER_SITE_OTHER): Payer: BC Managed Care – PPO | Admitting: Pulmonary Disease

## 2010-12-28 ENCOUNTER — Ambulatory Visit (INDEPENDENT_AMBULATORY_CARE_PROVIDER_SITE_OTHER)
Admission: RE | Admit: 2010-12-28 | Discharge: 2010-12-28 | Disposition: A | Discharge status: Home / Self Care | Payer: BC Managed Care – PPO | Source: Ambulatory Visit | Attending: Pulmonary Disease | Admitting: Pulmonary Disease

## 2010-12-28 VITALS — BP 128/88 | HR 70 | Temp 97.0°F | Ht 69.0 in | Wt 162.4 lb

## 2010-12-28 DIAGNOSIS — E785 Hyperlipidemia, unspecified: Secondary | ICD-10-CM

## 2010-12-28 DIAGNOSIS — Z0000 Encounter for general adult medical examination without abnormal findings: Secondary | ICD-10-CM

## 2010-12-28 DIAGNOSIS — D126 Benign neoplasm of colon, unspecified: Secondary | ICD-10-CM

## 2010-12-28 DIAGNOSIS — I10 Essential (primary) hypertension: Secondary | ICD-10-CM

## 2010-12-28 DIAGNOSIS — J40 Bronchitis, not specified as acute or chronic: Secondary | ICD-10-CM

## 2010-12-28 LAB — CBC WITH DIFFERENTIAL/PLATELET
Basophils Absolute: 0 10*3/uL (ref 0.0–0.1)
Basophils Relative: 0.6 % (ref 0.0–3.0)
Eosinophils Absolute: 0.2 10*3/uL (ref 0.0–0.7)
Lymphocytes Relative: 49.2 % — ABNORMAL HIGH (ref 12.0–46.0)
MCHC: 34.3 g/dL (ref 30.0–36.0)
MCV: 92.4 fl (ref 78.0–100.0)
Monocytes Absolute: 0.5 10*3/uL (ref 0.1–1.0)
Neutrophils Relative %: 38.4 % — ABNORMAL LOW (ref 43.0–77.0)
Platelets: 252 10*3/uL (ref 150.0–400.0)
RDW: 13.4 % (ref 11.5–14.6)

## 2010-12-28 LAB — URINALYSIS
Bilirubin Urine: NEGATIVE
Leukocytes, UA: NEGATIVE
pH: 6 (ref 5.0–8.0)

## 2010-12-28 LAB — BASIC METABOLIC PANEL
CO2: 28 mEq/L (ref 19–32)
Chloride: 106 mEq/L (ref 96–112)
Potassium: 4.3 mEq/L (ref 3.5–5.1)

## 2010-12-28 LAB — HEPATIC FUNCTION PANEL
ALT: 31 U/L (ref 0–53)
AST: 30 U/L (ref 0–37)
Alkaline Phosphatase: 58 U/L (ref 39–117)
Bilirubin, Direct: 0.1 mg/dL (ref 0.0–0.3)
Total Bilirubin: 0.8 mg/dL (ref 0.3–1.2)
Total Protein: 7.3 g/dL (ref 6.0–8.3)

## 2010-12-28 LAB — LIPID PANEL
Total CHOL/HDL Ratio: 3
VLDL: 6 mg/dL (ref 0.0–40.0)

## 2010-12-28 LAB — PSA: PSA: 2.93 ng/mL (ref 0.10–4.00)

## 2010-12-28 LAB — TSH: TSH: 1.1 u[IU]/mL (ref 0.35–5.50)

## 2010-12-28 MED ORDER — AMLODIPINE BESYLATE 10 MG PO TABS: 10.0000 mg | ORAL_TABLET | Freq: Every day | ORAL | Status: DC

## 2010-12-28 MED ORDER — LEVOFLOXACIN 500 MG PO TABS: 500.0000 mg | ORAL_TABLET | Freq: Every day | ORAL | Status: AC

## 2010-12-28 NOTE — Patient Instructions (Signed)
Today we updated your med list & refilled your Amlodipine & Levaquin...  We also did your follow up CXR, EKG, & Fasting blood work>    Please call the PHONE TREE in a few days for your results:    Dial [phone] & when prompted enter your pt number followed by the # symbol    Your pt number= [phone]#  Keep check on your BP at home> Call for any problems... Let's plan a follow up eval in 1 yr, sooner if needed.Marland KitchenMarland Kitchen

## 2010-12-28 NOTE — Progress Notes (Signed)
Subjective:    Patient ID: Travis Bell, male    DOB: 1946-05-31, 65 y.o.   MRN: [id number]  HPI 65 y/o WM here for a follow up visit... He has mult medical problems including:  Hx Sinusitis/ Bronchitis/ Allergies;  Mild Hyperchol;  Divertics/ Colon Polyps;  Hx Lentigo skin lesion...  ~  December 08, 2009:  he remains in excellent medical health- still running regularly & working out at [employer]... no intercurrent medical problems, feeling well w/o new complaints or concerns...  he requests refill Levaquin for the occas sinus infection, and is due for a TDaP vaccination today...  ~  December 28, 2010:  Yearly ROV & CPX doing well- he has cut back sl on his running due to his knees but still exerc regularly & works out in [employer], no new complaints or concerns...    Sinusitis/ Bronchitis>  Doing well overall but requests Levaquin to keep on hand for the occas sinus infection...    Chol>  LDL not at goal but he prefers diet Rx;  Denies CP, palpit, dizzy, SOB, edema, etc;  Today's FLP ==> TChol 204, TG 30, HDL 72, LDL 121;  rec to continue diet, exercise, etc...    GI>  Hx divertics & prev polyps, last colon 2009 by DrOrr w/ one tub adenoma removed, due for f/u colon in 2014 & requests DrKaplan.    Others>  Prob list reviewed- denies headaches, skin lesions, feeling well overall, etc...     Health Maintenance: ~  GI:  colon per DrOrr 8/09 reported by the pt to be "OK"... f/u due 71yrs due to hx polyps. ~  GU:  CPX 3/12 showed DRE= neg, & PSA ==> 2.93 represents slow rise... ~  Immunization:  he refuses Flu vaccines;  had PNEUMOVAX in 2000 at age 65;  had Tetanus shot 2000 & we gave TDAP 3/11.   Past Medical History  Diagnosis Date  . Allergic rhinitis   . Sinusitis   . Bronchitis   . Nonspecific abnormal electrocardiogram (ECG) (EKG)   . Diverticulosis of colon (without mention of hemorrhage)   . Benign neoplasm of colon   . Headache   . Dysthymic disorder   . Lentigo     No past surgical  history on file.   Outpatient Encounter Prescriptions as of 12/28/2010  Medication Sig Dispense Refill  . amLODipine (NORVASC) 10 MG tablet Take 10 mg by mouth daily.        Marland Kitchen aspirin 81 MG tablet Take 81 mg by mouth daily.        Marland Kitchen DISCONTD: Doxylamine Succinate, Sleep, (UNISOM) 25 MG tablet Take 25 mg by mouth at bedtime as needed.          No Known Allergies   Review of Systems    The patient denies fever, chills, sweats, anorexia, fatigue, weakness, malaise, weight loss, sleep disorder, blurring, diplopia, eye irritation, eye discharge, vision loss, eye pain, photophobia, earache, ear discharge, tinnitus, decreased hearing, nasal congestion, nosebleeds, sore throat, hoarseness, chest pain, palpitations, syncope, dyspnea on exertion, orthopnea, PND, peripheral edema, cough, dyspnea at rest, excessive sputum, hemoptysis, wheezing, pleurisy, nausea, vomiting, diarrhea, constipation, change in bowel habits, abdominal pain, melena, hematochezia, jaundice, gas/bloating, indigestion/heartburn, dysphagia, odynophagia, dysuria, hematuria, urinary frequency, urinary hesitancy, nocturia, incontinence, back pain, joint pain, joint swelling, muscle cramps, muscle weakness, stiffness, arthritis, sciatica, restless legs, leg pain at night, leg pain with exertion, rash, itching, dryness, suspicious lesions, paralysis, paresthesias, seizures, tremors, vertigo, transient blindness, frequent falls,  frequent headaches, difficulty walking, depression, anxiety, memory loss, confusion, cold intolerance, heat intolerance, polydipsia, polyphagia, polyuria, unusual weight change, abnormal bruising, bleeding, enlarged lymph nodes, urticaria, allergic rash, hay fever, and recurrent infections.     Objective:   Physical Exam     WD, WN, 65 y/o WM in NAD... GENERAL:  Alert & oriented; pleasant & cooperative... HEENT:  [place]/AT, EOM-wnl, PERRLA, Fundi-benign, EACs-clear, TMs-wnl, NOSE-clear, THROAT-clear & wnl. NECK:   Supple w/ full ROM; no JVD; normal carotid impulses w/o bruits; no thyromegaly or nodules palpated; no lymphadenopathy. CHEST:  Clear to P & A; without wheezes/ rales/ or rhonchi heard... HEART:  Regular Rhythm; without murmurs/ rubs/ or gallops detected... ABDOMEN:  Soft & nontender; normal bowel sounds; no organomegaly or masses palpated... EXT: without deformities or arthritic changes; no varicose veins/ venous insuffic/ or edema... NEURO:  CN's intact; motor testing normal; sensory testing normal; gait normal & balance OK. DERM:  No lesions noted; no rash etc...   Assessment & Plan:

## 2010-12-29 ENCOUNTER — Encounter: Payer: Self-pay | Admitting: Pulmonary Disease

## 2010-12-29 NOTE — Assessment & Plan Note (Signed)
Stable w/o acute symptoms, he just requests Rx for Levaquin to keep on hand for sinusitis/ bronchitis episodes since it works so well for him.Marland KitchenMarland Kitchen

## 2010-12-29 NOTE — Assessment & Plan Note (Signed)
He was friends w/ DrOrr before he left Doctor, general practice for [employer];  Last colonoscopy was 8/09 per pt & reported to be OK by pt (we don't have DrOrr's record);  F/u due 1yrs due to hx adenoma in past & he requests DrKaplan to resume his GI care when needed in 2014.Marland KitchenMarland Kitchen

## 2010-12-29 NOTE — Assessment & Plan Note (Signed)
He prefers diet Rx alone & he exercises regularly as well... FLP similar to the last few yrs w/ TChol ~200 & LDL ~120 but w/ good HDL ~70 & excellent TGs;  He will continue diet + exercise.Marland KitchenMarland Kitchen

## 2010-12-29 NOTE — Assessment & Plan Note (Signed)
CPX today w/ CXR, EKG, Fasting labs all reviewed w/ the pt.Marland KitchenMarland Kitchen

## 2010-12-29 NOTE — Assessment & Plan Note (Signed)
Newly diagnosed HBP this yr & started on Amlodipine by TP w/ incr to 10mg  /d at pt request... He'd rather keep it to one med & BP at home are now 120-130/ 80-90 range;  We discussed continuing the Norvasc 10mg /d + low sodium etc... Monitor BP at home & ccall for problems.Marland KitchenMarland Kitchen

## 2011-02-12 NOTE — Op Note (Signed)
NAME:  Travis Bell, Travis Bell NO.:  [id number]   MEDICAL RECORD NO.:  [id number]          PATIENT TYPE:  AMB   LOCATION:  ENDO                         FACILITY:  [facility]   PHYSICIAN:  Georgiana Spinner, M.D.    DATE OF BIRTH:  [birth year]   DATE OF PROCEDURE:  05/12/2008  DATE OF DISCHARGE:                               OPERATIVE REPORT   PROCEDURE:  Colonoscopy.   INDICATIONS:  Colon polyps, colon cancer screening.   ANESTHESIA:  Fentanyl 125 mcg, Versed 12 mg, Benadryl 50 mg.   PROCEDURE:  With the patient mildly sedated in the left lateral  decubitus position, the Pentax videoscopic colonoscope was inserted in  the rectum and passed under direct vision to the descending colon where  a twist in the colon prevented [place] from passing the scope any further  despite turning him on his back and his right side using pressure.  So,  from this point, the endoscope was withdrawn taking circumferential  views of the distal colon, and subsequently the Pentax videoscopic  pediatric colonoscope was inserted into the rectum, and it too was  passed to this point, but subsequently we were able to open this area  and pass the colonoscope under direct vision with pressure applied to  the cecum, identified by base of cecum and ileocecal valve both which  were photographed.  From this point, the colonoscope was slowly  withdrawn taking circumferential views of colonic mucosa as we withdrew  all the way to the rectum which appeared normal on direct and showed  hemorrhoids on retroflexed view.  The endoscope was straightened and  withdrawn.  The patient's vital signs, pulse oximeter remained stable.  The patient tolerated procedure well without apparent complications.   FINDINGS:  Difficult colonoscopy due to spasm and twist of the  descending colon but no abnormalities noted other than internal  hemorrhoids.   PLAN:  Have the patient follow up with me in 5 years or as needed.     ______________________________  Georgiana Spinner, M.D.     GMO/MEDQ  D:  05/12/2008  T:  05/12/2008  Job:  [id number]   cc:   Lonzo Cloud. Kriste Basque, MD  [address]  [place]  [place] [address]

## 2011-02-15 NOTE — Op Note (Signed)
   NAME:  Travis Bell, Travis Bell                           ACCOUNT NO.:  [id number]   MEDICAL RECORD NO.:  [id number]                   PATIENT TYPE:  AMB   LOCATION:  ENDO                                 FACILITY:  [facility]   PHYSICIAN:  Georgiana Spinner, M.D.                 DATE OF BIRTH:  17-Jun-1946   DATE OF PROCEDURE:  07/15/2002  DATE OF DISCHARGE:                                 OPERATIVE REPORT   PREOPERATIVE DIAGNOSIS:  Colonoscopy.   INDICATIONS:  Colon polyps.   ANESTHESIA:  Demerol 75 mg, Versed 7.5 mg.   DESCRIPTION OF PROCEDURE:  With the patient mildly sedated in the left  lateral decubitus position, a rectal exam was performed, which was  unremarkable.  Subsequently the Olympus videoscopic colonoscope was inserted  in the rectum and passed under direct vision to the cecum, identified by  ileocecal valve and appendiceal orifice, both of which were photographed.  From this point the colonoscope was slowly withdrawn, taking circumferential  views of the entire colonic mucosa, stopping only in the rectum, which  appeared normal on direct and retroflexed view.  The endoscope was  straightened and withdrawn.  The patient's vital signs and pulse oximetry  remained stable.  The patient tolerated the procedure well and without  apparent complication.   FINDINGS:  Unremarkable colonoscopic examination other than possibly small  internal hemorrhoids.   PLAN:  Have the patient follow up with me in five years or as needed.                                               Georgiana Spinner, M.D.    GMO/MEDQ  D:  07/15/2002  T:  07/16/2002  Job:  [id number]   cc:   Lonzo Cloud. Kriste Basque, M.D. [facility]

## 2011-12-15 ENCOUNTER — Other Ambulatory Visit: Payer: Self-pay | Admitting: Pulmonary Disease

## 2011-12-17 ENCOUNTER — Telehealth: Payer: Self-pay | Admitting: Pulmonary Disease

## 2011-12-17 MED ORDER — AMLODIPINE BESYLATE 10 MG PO TABS: 10.0000 mg | ORAL_TABLET | Freq: Every day | ORAL | Status: DC

## 2011-12-17 NOTE — Telephone Encounter (Signed)
Spoke with pt and advised will refill norvasc, and to be sure to keep May appt for additional refills. Pt verbalized understanding, and refill was sent to [facility].

## 2012-01-10 ENCOUNTER — Encounter: Payer: BC Managed Care – PPO | Admitting: Pulmonary Disease

## 2012-02-11 ENCOUNTER — Ambulatory Visit (INDEPENDENT_AMBULATORY_CARE_PROVIDER_SITE_OTHER)
Admission: RE | Admit: 2012-02-11 | Discharge: 2012-02-11 | Disposition: A | Discharge status: Home / Self Care | Payer: BC Managed Care – PPO | Source: Ambulatory Visit | Attending: Pulmonary Disease | Admitting: Pulmonary Disease

## 2012-02-11 ENCOUNTER — Ambulatory Visit (INDEPENDENT_AMBULATORY_CARE_PROVIDER_SITE_OTHER): Payer: BC Managed Care – PPO | Admitting: Pulmonary Disease

## 2012-02-11 ENCOUNTER — Encounter: Payer: Self-pay | Admitting: Pulmonary Disease

## 2012-02-11 VITALS — BP 152/92 | HR 62 | Temp 96.8°F | Ht 69.0 in | Wt 163.6 lb

## 2012-02-11 DIAGNOSIS — I10 Essential (primary) hypertension: Secondary | ICD-10-CM

## 2012-02-11 DIAGNOSIS — Z0000 Encounter for general adult medical examination without abnormal findings: Secondary | ICD-10-CM

## 2012-02-11 DIAGNOSIS — K5730 Diverticulosis of large intestine without perforation or abscess without bleeding: Secondary | ICD-10-CM

## 2012-02-11 DIAGNOSIS — N139 Obstructive and reflux uropathy, unspecified: Secondary | ICD-10-CM

## 2012-02-11 DIAGNOSIS — J309 Allergic rhinitis, unspecified: Secondary | ICD-10-CM

## 2012-02-11 DIAGNOSIS — E785 Hyperlipidemia, unspecified: Secondary | ICD-10-CM

## 2012-02-11 DIAGNOSIS — J329 Chronic sinusitis, unspecified: Secondary | ICD-10-CM

## 2012-02-11 DIAGNOSIS — D126 Benign neoplasm of colon, unspecified: Secondary | ICD-10-CM

## 2012-02-11 MED ORDER — AMLODIPINE BESYLATE 10 MG PO TABS: 10.0000 mg | ORAL_TABLET | Freq: Every day | ORAL | Status: DC

## 2012-02-11 MED ORDER — LEVOFLOXACIN 500 MG PO TABS: 500.0000 mg | ORAL_TABLET | Freq: Every day | ORAL | Status: AC

## 2012-02-11 MED ORDER — TADALAFIL 20 MG PO TABS: 20.0000 mg | ORAL_TABLET | Freq: Every day | ORAL | Status: DC | PRN

## 2012-02-11 NOTE — Patient Instructions (Signed)
Today we updated your med list in our EPIC system...    Continue your current medications the same...  Today we did your follow up CXR... Please return to our lab in the AM for your FASTING blood work...    We will call you w/ the reusults when avail...  We will arrange for an ECHOCARDIOGRAM to check your heart for any changes related to your BP etc...    We will call you w/ this report when avail...  Keep up the good work w/ diet & exercise...  Call for any questions.Marland KitchenMarland Kitchen

## 2012-02-11 NOTE — Progress Notes (Addendum)
Subjective:    Patient ID: Travis Bell, male    DOB: Feb 24, 1946, 66 y.o.   MRN: [id number]  HPI 66 y/o WM here for a follow up visit... He has mult medical problems including:  Hx Sinusitis/ Bronchitis/ Allergies;  Mild Hyperchol;  Divertics/ Colon Polyps;  Hx Lentigo skin lesion...  ~  December 08, 2009:  he remains in excellent medical health- still running regularly & working out at [employer]... no intercurrent medical problems, feeling well w/o new complaints or concerns...  he requests refill Levaquin for the occas sinus infection, and is due for a TDaP vaccination today...  ~  December 28, 2010:  Yearly ROV & CPX doing well- he has cut back sl on his running due to his knees but still exerc regularly & works out in [employer], no new complaints or concerns...    HBP> He was started on Norvasc5 & up-titrated to 10mg /d w/ BP improved to 130.80 range; rec continue same + low sodium etc...    Sinusitis/ Bronchitis>  Doing well overall but requests Levaquin to keep on hand for the occas sinus infection...    Chol>  LDL not at goal but he prefers diet Rx;  Denies CP, palpit, dizzy, SOB, edema, etc;  Today's FLP ==> TChol 204, TG 30, HDL 72, LDL 121;  rec to continue diet, exercise, etc...    GI>  Hx divertics & prev polyps, last colon 2009 by DrOrr w/ one tub adenoma removed, due for f/u colon in 2014 & requests DrKaplan.    Others>  Prob list reviewed- denies headaches, skin lesions, feeling well overall, etc...  ~  Feb 11, 2012:  Yearly ROV & Serenity is feeling well- good year, no new complaints or concerns;  He has been on Norvasc10mg /d & BP at home doing well at home he says, remains active, exercising regularly & no CP/ palpit/ SOB/ etc;  We reviewed prob list, meds, xrays & labs> see below>> CXR 5/13 showed heart at upper lim normal, tortuous Ao, clear lungs, ?left nipple shadow noted... repeat film w/ nipple marker confirmed. EKG 5/13 showed NSR, rate64, increased voltage, ?early repol ==> we will  recheck his 2DEcho. LABS 5/13:  FLP- ok x LDL=123;  Chems- ok x BS=107;  CBC- wnl;  TSH=1.78;  PSA=2.73   Problem List:    ALLERGIC RHINITIS (ICD-477.9) >> he uses OTC antihist as needed...  Hx of SINUSITIS (ICD-473.9) - Amoxicillin doesn't work for him & he likes Levaquin 500mg  & keeps this on hand.  Hx of BRONCHITIS (ICD-490) - he likes to keep Rx for Levaquin on hand for bronchitic infections... ~  CXR 3/11 showed clear lungs, NAD.Marland Kitchen. ~  CXR 3/12 showed heart at upper lim normal, lungs clear, NAD.Marland Kitchen. ~  CXR 5/13 showed heart at upper lim normal, tortuous Ao, clear lungs, ?left nipple shadow noted==> repeat film w/ nipple marker confirmed.  HYPERTENSION >> he has borderline HBP readings in past w/ 150/90 as the high & mostly 130s/80s... ~  2012:  We decided to start therapy w/ AMLODIPINE 5mg  & up-titrated to 10mg /d by TP; he reports home BPs 130s/80s... ~  5/13:  BP= 152/92 & he reports asymptomatic w/o CP, palpit, dizzy, SOB, edema, etc...  Hx of ABNORMAL ELECTROCARDIOGRAM (ICD-794.31) - his baseline EKG's have shown benign early repolarization changes and increased voltage... no change over the last 46yrs... 2DEcho 1999 showed normal heart chambers & valves- trivial MR, EF=60%... as noted he exercises regularly & still runs marathons... ~  EKG 3/11 showed NSR, rate 62, increased voltage and ?early repol, no acute changes... ~  EKG 3/12 showed NSR, rate67, increased voltage & ?early repol, NAD... ~  EKG 5/13 showed NSR, rate64, increased voltage, ?early repol, no change from old tracings...  HYPERCHOLESTEROLEMIA, BORDERLINE (ICD-272.4) - on diet alone, normal weight, good athlete... ~  FLP 4/08 showed TChol 173, TG 107, HDL 42, LDL 109 ~  FLP 7/09 showed TChol 193, TG 105, HDL 48, LDL 124 ~  FLP 3/11 showed TChol 201, TG 54, HDL 69, LDL 125 ~  FLP 3/12 showed TChol 204, TG 30, HDL 72, LDL 121 ~  FLP 5/13 showed TChol 197, TG 49, HDL 64, LDL 123  DIVERTICULOSIS OF COLON (ICD-562.10) &  COLONIC POLYPS (ICD-211.3) - he is friends w/ DrGOrr and colonoscopy 10/03 by him was neg x for sm hems... several 2-21mm polyps removed in 2000 by DrKaplan who also noted a few divertics... path= 3 hyperpl polyps & one tubular adenoma... f/u colon was done by DrOrr 8/09 before he moved to [facility] according to the pt- we don't have copy, pt reports that it was OK...  BPH >> exam shows enlarged smooth gland & pt denies LTOS etc... ~  PSAs had shown a slow steady rising pattern w/o increased velocity... ~  Labs 3/12 showed PSA= 2.93 ~  Labs 5/13 showed PSA= 2.73... He notes some ED symptoms & we wrote for CIALIS 20mg  prn.  Hx of HEADACHE (ICD-784.0)  Hx of ANXIETY DEPRESSION (ICD-300.4) - prev treated w/ Paxil yrs ago... no recurrent problems...  Hx of LENTIGO (ICD-709.09) - Derm f/u DrTafeen...  Health Maintenance: ~  GI:  colon per DrOrr 8/09 reported by the pt to be "OK"... f/u due 10yrs due to hx polyps. ~  GU:  CPX 5/13 showed DRE= sl enlarged diffuse gland, & PSA is steady ay 2.73 ~  Immunization:  he refuses Flu vaccines;  had PNEUMOVAX in 2000 at age 89;  had Tetanus shot 2000 & we gave TDAP 3/11;  Shingles Vaccine Rx written 5/13.   Past Medical History  Diagnosis Date  . Allergic rhinitis   . Sinusitis   . Bronchitis   . Nonspecific abnormal electrocardiogram (ECG) (EKG)   . Diverticulosis of colon (without mention of hemorrhage)   . Benign neoplasm of colon   . Headache   . Dysthymic disorder   . Lentigo     No past surgical history on file.   Outpatient Encounter Prescriptions as of 02/11/2012  Medication Sig Dispense Refill  . amLODipine (NORVASC) 10 MG tablet Take 1 tablet (10 mg total) by mouth daily.  90 tablet  3  . DISCONTD: amLODipine (NORVASC) 10 MG tablet Take 1 tablet (10 mg total) by mouth daily.  90 tablet  0  . levofloxacin (LEVAQUIN) 500 MG tablet Take 1 tablet (500 mg total) by mouth daily.  10 tablet  0  . tadalafil (CIALIS) 20 MG tablet Take 1  tablet (20 mg total) by mouth daily as needed for erectile dysfunction.  10 tablet  11  . DISCONTD: aspirin 81 MG tablet Take 81 mg by mouth daily.          No Known Allergies   Current Medications, Allergies, Past Medical History, Past Surgical History, Family History, and Social History were reviewed in [employer] record.   Review of Systems    The patient denies fever, chills, sweats, anorexia, fatigue, weakness, malaise, weight loss, sleep disorder, blurring, diplopia, eye irritation,  eye discharge, vision loss, eye pain, photophobia, earache, ear discharge, tinnitus, decreased hearing, nasal congestion, nosebleeds, sore throat, hoarseness, chest pain, palpitations, syncope, dyspnea on exertion, orthopnea, PND, peripheral edema, cough, dyspnea at rest, excessive sputum, hemoptysis, wheezing, pleurisy, nausea, vomiting, diarrhea, constipation, change in bowel habits, abdominal pain, melena, hematochezia, jaundice, gas/bloating, indigestion/heartburn, dysphagia, odynophagia, dysuria, hematuria, urinary frequency, urinary hesitancy, nocturia, incontinence, back pain, joint pain, joint swelling, muscle cramps, muscle weakness, stiffness, arthritis, sciatica, restless legs, leg pain at night, leg pain with exertion, rash, itching, dryness, suspicious lesions, paralysis, paresthesias, seizures, tremors, vertigo, transient blindness, frequent falls, frequent headaches, difficulty walking, depression, anxiety, memory loss, confusion, cold intolerance, heat intolerance, polydipsia, polyphagia, polyuria, unusual weight change, abnormal bruising, bleeding, enlarged lymph nodes, urticaria, allergic rash, hay fever, and recurrent infections.     Objective:   Physical Exam     WD, WN, 66 y/o WM in NAD... GENERAL:  Alert & oriented; pleasant & cooperative... HEENT:  [place]/AT, EOM-wnl, PERRLA, Fundi-benign, EACs-clear, TMs-wnl, NOSE-clear, THROAT-clear & wnl. NECK:  Supple w/ full ROM;  no JVD; normal carotid impulses w/o bruits; no thyromegaly or nodules palpated; no lymphadenopathy. CHEST:  Clear to P & A; without wheezes/ rales/ or rhonchi heard... HEART:  Regular Rhythm; without murmurs/ rubs/ or gallops detected... ABDOMEN:  Soft & nontender; normal bowel sounds; no organomegaly or masses palpated... EXT: without deformities or arthritic changes; no varicose veins/ venous insuffic/ or edema... NEURO:  CN's intact; motor testing normal; sensory testing normal; gait normal & balance OK. DERM:  No lesions noted; no rash etc...  RADIOLOGY DATA:  Reviewed in the EPIC EMR & discussed w/ the patient...  LABORATORY DATA:  Reviewed in the EPIC EMR & discussed w/ the patient...   Assessment & Plan:   CPX>>    AR/Hx Sinusitis>  He likes to keep Levaquin 500mg  Rx on hand for sinus infections etc...  HBP>  BP sl elev today on his Norvasc10mg /d; we discussed low sodium, take med every day, & monitor BP at home; we will recheck 2DEcho,  ABN EKG>  He has increased voltage, ?early repol, no changes; he had 2DEcho 1999 that was ok==> we will repeat 2DEcho.  CHOL>  His LDL has been in the 120 range but other parameters are wnl; continue diet Rx...  GI> Divertics, Polyps>  He is a personal friend of DrOrrs; last colon 8/09 & f/u due 5 yrs...  GU> BPH, mild ED>  He denies LTOS but prostate is big on DRE; try Cialis for ED complaints...  Anxiety>  He is stable w/o need for meds...  Hx Lentigo>  Followed by Mikey Kirschner...   Patient's Medications  New Prescriptions   LEVOFLOXACIN (LEVAQUIN) 500 MG TABLET    Take 1 tablet (500 mg total) by mouth daily.   TADALAFIL (CIALIS) 20 MG TABLET    Take 1 tablet (20 mg total) by mouth daily as needed for erectile dysfunction.  Previous Medications   No medications on file  Modified Medications   Modified Medication Previous Medication   AMLODIPINE (NORVASC) 10 MG TABLET amLODipine (NORVASC) 10 MG tablet      Take 1 tablet (10 mg  total) by mouth daily.    Take 1 tablet (10 mg total) by mouth daily.  Discontinued Medications   ASPIRIN 81 MG TABLET    Take 81 mg by mouth daily.

## 2012-02-12 ENCOUNTER — Ambulatory Visit: Payer: BC Managed Care – PPO | Admitting: Pulmonary Disease

## 2012-02-12 ENCOUNTER — Other Ambulatory Visit (INDEPENDENT_AMBULATORY_CARE_PROVIDER_SITE_OTHER): Payer: BC Managed Care – PPO

## 2012-02-12 DIAGNOSIS — I10 Essential (primary) hypertension: Secondary | ICD-10-CM

## 2012-02-12 DIAGNOSIS — E785 Hyperlipidemia, unspecified: Secondary | ICD-10-CM

## 2012-02-12 DIAGNOSIS — D126 Benign neoplasm of colon, unspecified: Secondary | ICD-10-CM

## 2012-02-12 DIAGNOSIS — N139 Obstructive and reflux uropathy, unspecified: Secondary | ICD-10-CM

## 2012-02-12 LAB — CBC WITH DIFFERENTIAL/PLATELET
Basophils Absolute: 0 10*3/uL (ref 0.0–0.1)
Basophils Relative: 0.6 % (ref 0.0–3.0)
Hemoglobin: 13.6 g/dL (ref 13.0–17.0)
Lymphs Abs: 2.4 10*3/uL (ref 0.7–4.0)
MCHC: 33 g/dL (ref 30.0–36.0)
MCV: 87.1 fl (ref 78.0–100.0)
Monocytes Absolute: 0.5 10*3/uL (ref 0.1–1.0)
Neutro Abs: 3.1 10*3/uL (ref 1.4–7.7)
RBC: 4.75 Mil/uL (ref 4.22–5.81)

## 2012-02-12 LAB — LIPID PANEL
LDL Cholesterol: 123 mg/dL — ABNORMAL HIGH (ref 0–99)
Total CHOL/HDL Ratio: 3
Triglycerides: 49 mg/dL (ref 0.0–149.0)

## 2012-02-12 LAB — HEPATIC FUNCTION PANEL
ALT: 23 U/L (ref 0–53)
Bilirubin, Direct: 0.1 mg/dL (ref 0.0–0.3)
Total Bilirubin: 0.6 mg/dL (ref 0.3–1.2)

## 2012-02-12 LAB — BASIC METABOLIC PANEL
Calcium: 9.5 mg/dL (ref 8.4–10.5)
Chloride: 102 mEq/L (ref 96–112)
Creatinine, Ser: 0.8 mg/dL (ref 0.4–1.5)
Sodium: 137 mEq/L (ref 135–145)

## 2012-02-12 LAB — PSA: PSA: 2.73 ng/mL (ref 0.10–4.00)

## 2012-02-12 LAB — TSH: TSH: 1.78 u[IU]/mL (ref 0.35–5.50)

## 2012-02-14 ENCOUNTER — Encounter: Payer: BC Managed Care – PPO | Admitting: Pulmonary Disease

## 2012-02-14 ENCOUNTER — Telehealth: Payer: Self-pay | Admitting: Pulmonary Disease

## 2012-02-14 NOTE — Telephone Encounter (Signed)
Notes Recorded by Michele Mcalpine, MD on 02/13/2012 at 2:05 PM Please notify patient> Labs all ok & similar to last yr... FLP- ok but LDL not at goal (123) & he is committed to diet alone... Chems- ok but FBS borderline at 107; CBC/ TSH/ PSA> all wnl  Pt aware of results; however CXR was not read yet; please have SN review and call patient with results. Also, pt was told to let SN know that Echo has been scheduled for 03-30-12.

## 2012-02-17 ENCOUNTER — Other Ambulatory Visit (HOSPITAL_COMMUNITY): Payer: BC Managed Care – PPO

## 2012-02-17 ENCOUNTER — Other Ambulatory Visit: Payer: Self-pay | Admitting: Pulmonary Disease

## 2012-02-17 DIAGNOSIS — I10 Essential (primary) hypertension: Secondary | ICD-10-CM

## 2012-02-17 NOTE — Telephone Encounter (Signed)
Called and spoke with pt about his cxr results per SN.  Pt is aware that per SN will need to come back in for repeat cxr pa--with nipple markers.  Pt is aware that order is in the computer and he can come in anytime for this.  We will call once results are back.

## 2012-02-20 ENCOUNTER — Ambulatory Visit (INDEPENDENT_AMBULATORY_CARE_PROVIDER_SITE_OTHER)
Admission: RE | Admit: 2012-02-20 | Discharge: 2012-02-20 | Disposition: A | Discharge status: Home / Self Care | Payer: BC Managed Care – PPO | Source: Ambulatory Visit | Attending: Pulmonary Disease | Admitting: Pulmonary Disease

## 2012-02-20 DIAGNOSIS — I10 Essential (primary) hypertension: Secondary | ICD-10-CM

## 2012-02-27 ENCOUNTER — Other Ambulatory Visit: Payer: Self-pay | Admitting: Pulmonary Disease

## 2012-03-06 ENCOUNTER — Encounter: Payer: Self-pay | Admitting: Adult Health

## 2012-03-06 ENCOUNTER — Ambulatory Visit (INDEPENDENT_AMBULATORY_CARE_PROVIDER_SITE_OTHER): Payer: BC Managed Care – PPO | Admitting: Adult Health

## 2012-03-06 VITALS — BP 140/86 | HR 76 | Temp 97.9°F | Ht 69.0 in | Wt 163.6 lb

## 2012-03-06 DIAGNOSIS — H6120 Impacted cerumen, unspecified ear: Secondary | ICD-10-CM

## 2012-03-06 DIAGNOSIS — H6121 Impacted cerumen, right ear: Secondary | ICD-10-CM | POA: Insufficient documentation

## 2012-03-06 NOTE — Assessment & Plan Note (Signed)
Right ear cerumen impaction  Successful removal with ear irrigation -tolerated well.   Plan  Debrox As needed   Please contact office for sooner follow up if symptoms do not improve or worsen or seek emergency care

## 2012-03-06 NOTE — Progress Notes (Signed)
Subjective:    Patient ID: Travis Bell, male    DOB: 16-Feb-1946, 66 y.o.   MRN: [id number]  HPI 66 y/o WM   Hx Sinusitis/ Bronchitis/ Allergies;  Mild Hyperchol;  Divertics/ Colon Polyps;  Hx Lentigo skin lesion...  ~  December 08, 2009:  he remains in excellent medical health- still running regularly & working out at [employer]... no intercurrent medical problems, feeling well w/o new complaints or concerns...  he requests refill Levaquin for the occas sinus infection, and is due for a TDaP vaccination today...  ~  December 28, 2010:  Yearly ROV & CPX doing well- he has cut back sl on his running due to his knees but still exerc regularly & works out in [employer], no new complaints or concerns...    HBP> He was started on Norvasc5 & up-titrated to 10mg /d w/ BP improved to 130.80 range; rec continue same + low sodium etc...    Sinusitis/ Bronchitis>  Doing well overall but requests Levaquin to keep on hand for the occas sinus infection...    Chol>  LDL not at goal but he prefers diet Rx;  Denies CP, palpit, dizzy, SOB, edema, etc;  Today's FLP ==> TChol 204, TG 30, HDL 72, LDL 121;  rec to continue diet, exercise, etc...    GI>  Hx divertics & prev polyps, last colon 2009 by Travis Bell w/ one tub adenoma removed, due for f/u colon in 2014 & requests Travis Bell.    Others>  Prob list reviewed- denies headaches, skin lesions, feeling well overall, etc...  ~  Feb 11, 2012:  Yearly ROV & Travis Bell is feeling well- good year, no new complaints or concerns;  He has been on Norvasc10mg /d & BP at home doing well at home he says, remains active, exercising regularly & no CP/ palpit/ SOB/ etc;  We reviewed prob list, meds, xrays & labs> see below>> CXR 5/13 showed heart at upper lim normal, tortuous Ao, clear lungs, ?left nipple shadow noted... repeat film w/ nipple marker confirmed. EKG 5/13 showed NSR, rate64, increased voltage, ?early repol ==> we will recheck his 2DEcho. LABS 5/13:  FLP- ok x LDL=123;  Chems- ok x BS=107;   CBC- wnl;  TSH=1.78;  PSA=2.73  03/06/2012 Acute OV  Complains of right clogged ear after using a Q-tip x 5 days. Has tried drops and they haven't helped.  Feels like he is in a barrel.  No drainage or fever.   Problem List:    ALLERGIC RHINITIS (ICD-477.9) >> he uses OTC antihist as needed...  Hx of SINUSITIS (ICD-473.9) - Amoxicillin doesn't work for him & he likes Levaquin 500mg  & keeps this on hand.  Hx of BRONCHITIS (ICD-490) - he likes to keep Rx for Levaquin on hand for bronchitic infections... ~  CXR 3/11 showed clear lungs, NAD.Marland Kitchen. ~  CXR 3/12 showed heart at upper lim normal, lungs clear, NAD.Marland Kitchen. ~  CXR 5/13 showed heart at upper lim normal, tortuous Ao, clear lungs, ?left nipple shadow noted==> repeat film w/ nipple marker confirmed.  HYPERTENSION >> he has borderline HBP readings in past w/ 150/90 as the high & mostly 130s/80s... ~  2012:  We decided to start therapy w/ AMLODIPINE 5mg  & up-titrated to 10mg /d by TP; he reports home BPs 130s/80s... ~  5/13:  BP= 152/92 & he reports asymptomatic w/o CP, palpit, dizzy, SOB, edema, etc...  Hx of ABNORMAL ELECTROCARDIOGRAM (ICD-794.31) - his baseline EKG's have shown benign early repolarization changes and increased voltage... no change  over the last 43yrs... 2DEcho 1999 showed normal heart chambers & valves- trivial MR, EF=60%... as noted he exercises regularly & still runs marathons... ~  EKG 3/11 showed NSR, rate 62, increased voltage and ?early repol, no acute changes... ~  EKG 3/12 showed NSR, rate67, increased voltage & ?early repol, NAD... ~  EKG 5/13 showed NSR, rate64, increased voltage, ?early repol, no change from old tracings...  HYPERCHOLESTEROLEMIA, BORDERLINE (ICD-272.4) - on diet alone, normal weight, good athlete... ~  FLP 4/08 showed TChol 173, TG 107, HDL 42, LDL 109 ~  FLP 7/09 showed TChol 193, TG 105, HDL 48, LDL 124 ~  FLP 3/11 showed TChol 201, TG 54, HDL 69, LDL 125 ~  FLP 3/12 showed TChol 204, TG 30, HDL  72, LDL 121 ~  FLP 5/13 showed TChol 197, TG 49, HDL 64, LDL 123  DIVERTICULOSIS OF COLON (ICD-562.10) & COLONIC POLYPS (ICD-211.3) - he is friends w/ Travis Bell and colonoscopy 10/03 by him was neg x for sm hems... several 2-90mm polyps removed in 2000 by Travis Bell who also noted a few divertics... path= 3 hyperpl polyps & one tubular adenoma... f/u colon was done by Travis Bell 8/09 before he moved to [address] according to the pt- we don't have copy, pt reports that it was OK...  BPH >> exam shows enlarged smooth gland & pt denies LTOS etc... ~  PSAs had shown a slow steady rising pattern w/o increased velocity... ~  Labs 3/12 showed PSA= 2.93 ~  Labs 5/13 showed PSA= 2.73... He notes some ED symptoms & we wrote for CIALIS 20mg  prn.  Hx of HEADACHE (ICD-784.0)  Hx of ANXIETY DEPRESSION (ICD-300.4) - prev treated w/ Paxil yrs ago... no recurrent problems...  Hx of LENTIGO (ICD-709.09) - Derm f/u Travis Bell...  Health Maintenance: ~  GI:  colon per Travis Bell 8/09 reported by the pt to be "OK"... f/u due 61yrs due to hx polyps. ~  GU:  CPX 5/13 showed DRE= sl enlarged diffuse gland, & PSA is steady ay 2.73 ~  Immunization:  he refuses Flu vaccines;  had PNEUMOVAX in 2000 at age 62;  had Tetanus shot 2000 & we gave TDAP 3/11;  Shingles Vaccine Rx written 5/13.   Past Medical History  Diagnosis Date  . Allergic rhinitis   . Sinusitis   . Bronchitis   . Nonspecific abnormal electrocardiogram (ECG) (EKG)   . Diverticulosis of colon (without mention of hemorrhage)   . Benign neoplasm of colon   . Headache   . Dysthymic disorder   . Lentigo     No past surgical history on file.   Outpatient Encounter Prescriptions as of 03/06/2012  Medication Sig Dispense Refill  . amLODipine (NORVASC) 10 MG tablet Take 1 tablet (10 mg total) by mouth daily.  90 tablet  3  . Calcium Carbonate-Vitamin D (CALCIUM + D PO) Take by mouth daily.      . fish oil-omega-3 fatty acids 1000 MG capsule Take 2 g by mouth daily.       . Multiple Vitamins-Minerals (MULTIVITAMIN PO) Take by mouth daily.      . Nutritional Supplements (JOINT FORMULA PO) Take by mouth daily.      . tadalafil (CIALIS) 20 MG tablet Take 1 tablet (20 mg total) by mouth daily as needed for erectile dysfunction.  10 tablet  11  . DISCONTD: amLODipine (NORVASC) 10 MG tablet TAKE 1 TABLET DAILY  90 tablet  3    No Known Allergies   Current Medications,  Allergies, Past Medical History, Past Surgical History, Family History, and Social History were reviewed in [employer] record.   Review of Systems Constitutional:   No  weight loss, night sweats,  Fevers, chills, fatigue, or  lassitude.  HEENT:   No headaches,  Difficulty swallowing,  Tooth/dental problems, or  Sore throat,                No sneezing, itching,  nasal congestion, post nasal drip,   CV:  No chest pain,  Orthopnea, PND, swelling in lower extremities, anasarca, dizziness, palpitations, syncope.   GI  No heartburn, indigestion, abdominal pain, nausea, vomiting, diarrhea, change in bowel habits, loss of appetite, bloody stools.   Resp: No shortness of breath with exertion or at rest.  No excess mucus, no productive cough,  No non-productive cough,  No coughing up of blood.  No change in color of mucus.  No wheezing.  No chest wall deformity  Skin: no rash or lesions.  GU: no dysuria, change in color of urine, no urgency or frequency.  No flank pain, no hematuria   MS:  No joint pain or swelling.  No decreased range of motion.  No back pain.  Psych:  No change in mood or affect. No depression or anxiety.  No memory loss.         Objective:   Physical Exam     WD, WN, 66 y/o WM in NAD... GENERAL:  Alert & oriented; pleasant & cooperative... HEENT:  [place]/AT,  Right EAC with wax impaction   Ear irrigation with sucessful removal, TM and EAC clear  Tolerated well.   Assessment & Plan:

## 2012-03-06 NOTE — Patient Instructions (Signed)
Debrox  As needed  For ear wax  Do not use Qtips in ears.  Please contact office for sooner follow up if symptoms do not improve or worsen or seek emergency care

## 2012-03-30 ENCOUNTER — Other Ambulatory Visit: Payer: Self-pay

## 2012-03-30 ENCOUNTER — Ambulatory Visit (HOSPITAL_COMMUNITY): Payer: BC Managed Care – PPO | Attending: Cardiology | Admitting: Radiology

## 2012-03-30 DIAGNOSIS — I059 Rheumatic mitral valve disease, unspecified: Secondary | ICD-10-CM

## 2012-03-30 DIAGNOSIS — E785 Hyperlipidemia, unspecified: Secondary | ICD-10-CM | POA: Insufficient documentation

## 2012-03-30 DIAGNOSIS — I10 Essential (primary) hypertension: Secondary | ICD-10-CM | POA: Insufficient documentation

## 2012-03-30 NOTE — Progress Notes (Signed)
Echocardiogram performed.  

## 2012-04-03 ENCOUNTER — Other Ambulatory Visit: Payer: Self-pay | Admitting: Pulmonary Disease

## 2012-04-03 MED ORDER — LOSARTAN POTASSIUM 100 MG PO TABS: 100.0000 mg | ORAL_TABLET | Freq: Every day | ORAL | Status: DC

## 2012-04-07 IMAGING — CR DG CHEST 2V
2 series · 2 of 2 positions shown · non-contrast
Comparison: Chest x-ray of 12/08/2009

CLINICAL DATA: Hypertension, former smoker, for physical exam

CHEST - 2 VIEW

[view not recorded (1 of 2)]
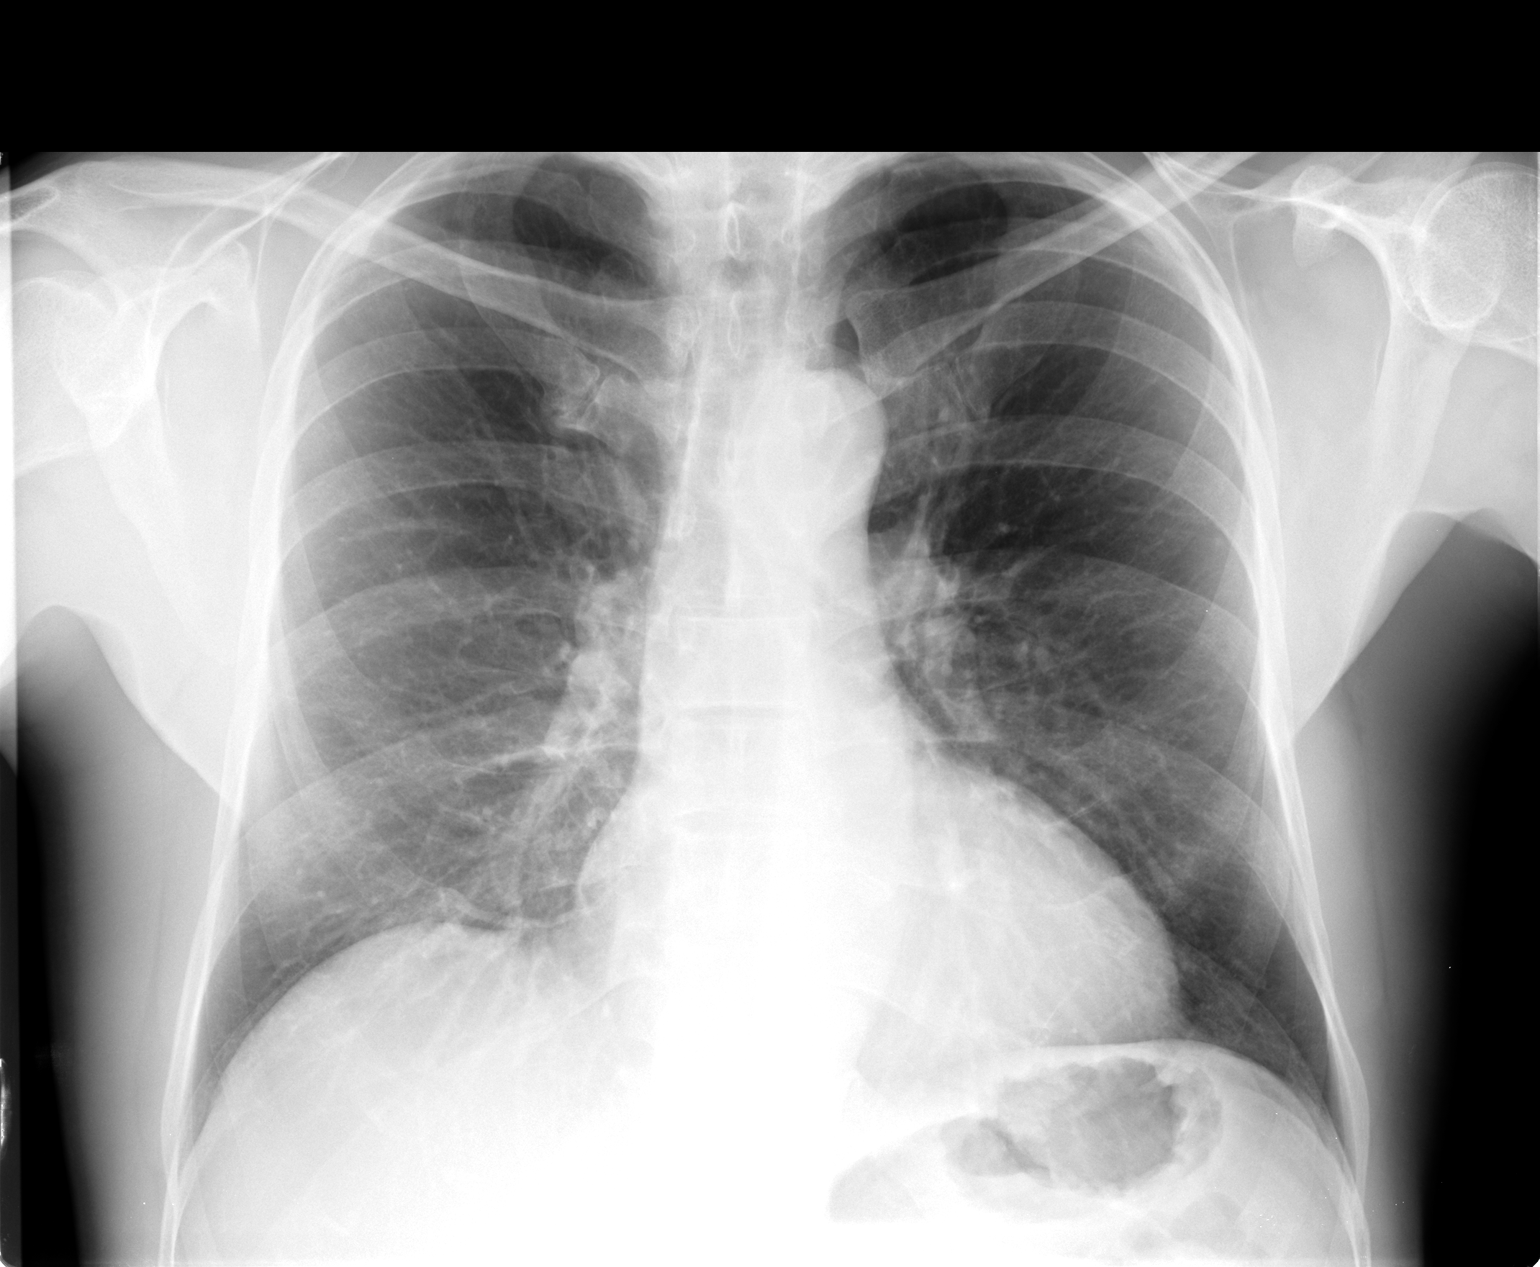

[view not recorded (2 of 2)]
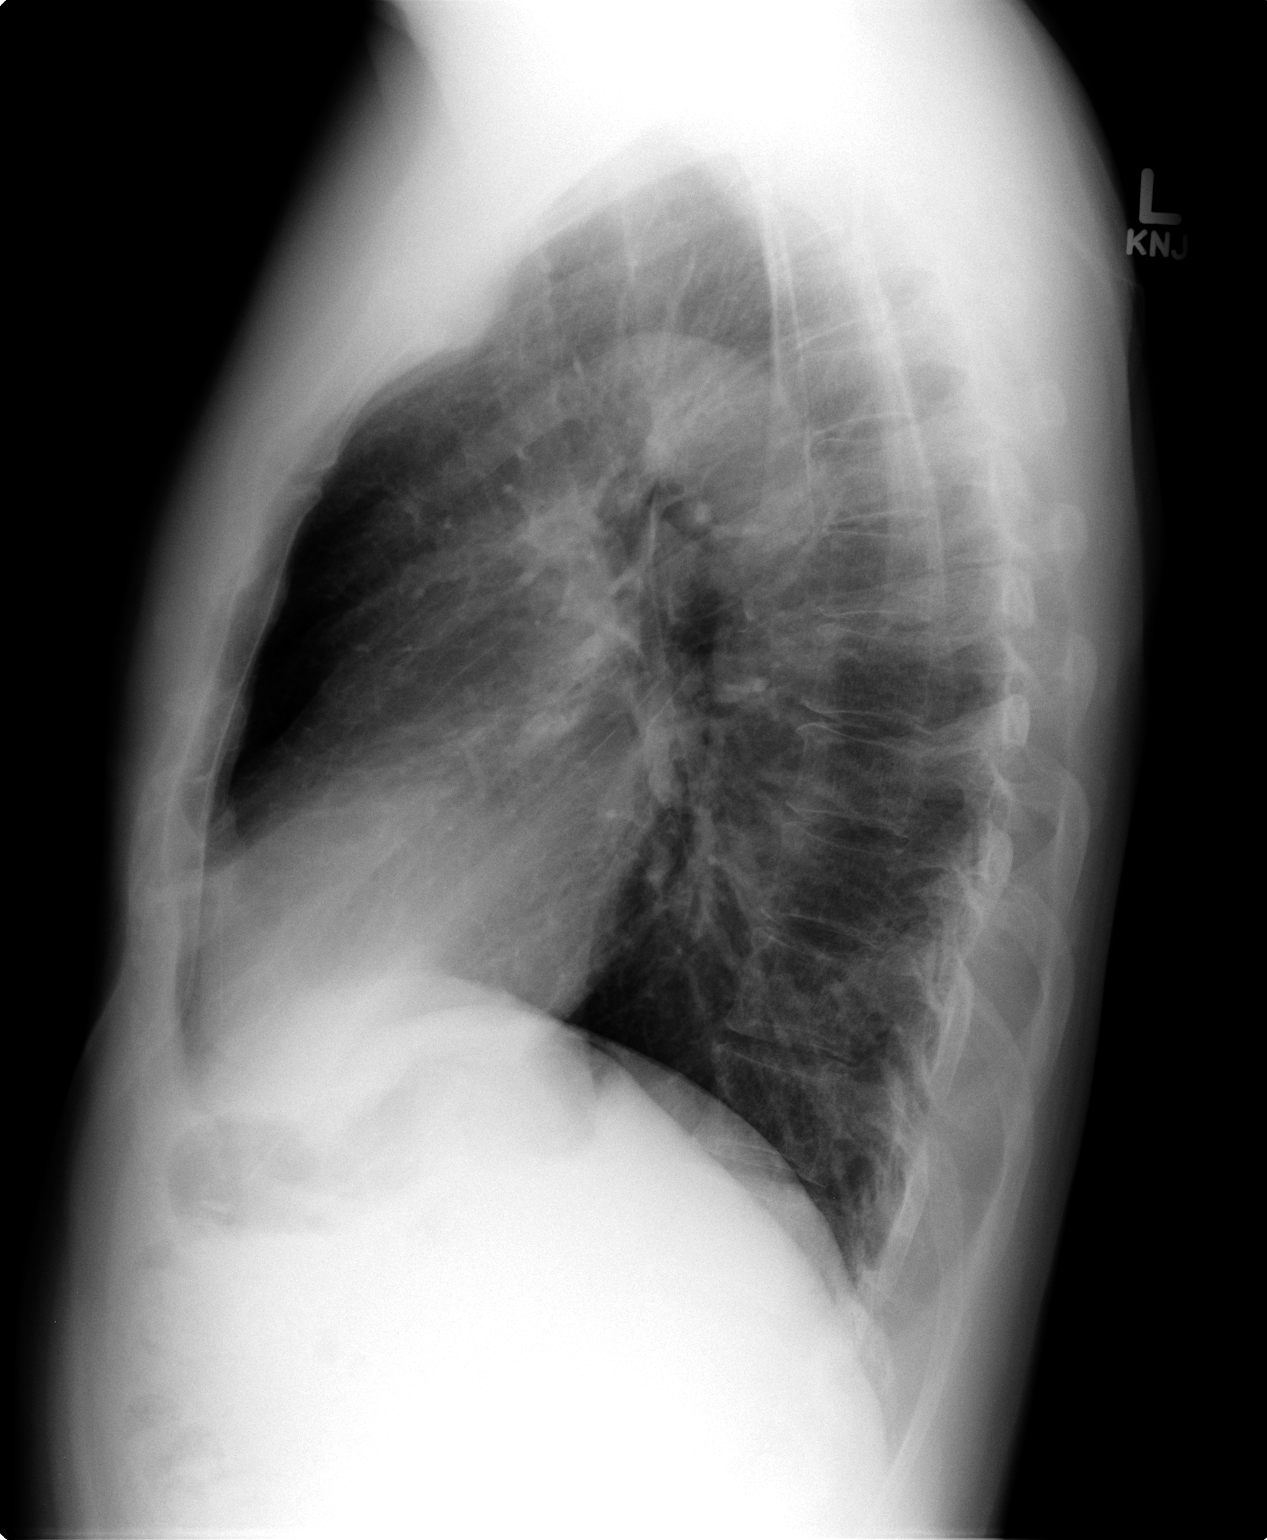

[2 of 2 positions shown; findings below may reference images not displayed]

FINDINGS: The lungs are clear.  Mediastinal contours appear stable.
The heart is within upper limits of normal.  No acute bony
abnormality is seen.
IMPRESSION: Stable chest x-ray.  No active lung disease.

## 2012-12-02 ENCOUNTER — Encounter: Payer: Self-pay | Admitting: Adult Health

## 2012-12-02 ENCOUNTER — Ambulatory Visit (INDEPENDENT_AMBULATORY_CARE_PROVIDER_SITE_OTHER): Payer: BC Managed Care – PPO | Admitting: Adult Health

## 2012-12-02 VITALS — BP 150/80 | HR 86 | Temp 98.5°F | Ht 69.0 in | Wt 164.8 lb

## 2012-12-02 DIAGNOSIS — S39012A Strain of muscle, fascia and tendon of lower back, initial encounter: Secondary | ICD-10-CM | POA: Insufficient documentation

## 2012-12-02 DIAGNOSIS — IMO0002 Reserved for concepts with insufficient information to code with codable children: Secondary | ICD-10-CM

## 2012-12-02 MED ORDER — METAXALONE 800 MG PO TABS: 800.0000 mg | ORAL_TABLET | Freq: Three times a day (TID) | ORAL | Status: DC | PRN

## 2012-12-02 NOTE — Progress Notes (Signed)
Subjective:    Patient ID: Travis Bell, male    DOB: 01-25-1946, 67 y.o.   MRN: [id number]  HPI 67 y/o WM   Hx Sinusitis/ Bronchitis/ Allergies;  Mild Hyperchol;  Divertics/ Colon Polyps;  Hx Lentigo skin lesion...  12/02/2012 Acute OV  Complains of back pain for 6 months , worse for last week.  Returned from [facility] yesterday.  Runs daily - does marathon races .  Ran 1/2 marathon 04/2012 started experiencing w/ low back pain.  Pain and stiffness started on /off. Controlled with advil.  Ran 2 more marathons fall of 2013 with intermittent pain and muscle spasm.  Used a inversion table with some relief.  Over last 1 week with more pain, sneezing caused a severe pain in right low back pain.  More pain w/ bending , twisting, Pain w/ long periods of running.  Has used advil and tramadol  Localized on right low back  No radiating pain or leg weakness.  No urinary symptoms .  Wants to stay active. Is not going to stop running.   Is running marathon on 3/8. Wants to be better by then.  Is very active with weight lifting. -mainly upper body.  No fever, rash, chest pain, syncope, weight loss.  Eats healthy.   Problem List:    ALLERGIC RHINITIS (ICD-477.9) >> he uses OTC antihist as needed...  Hx of SINUSITIS (ICD-473.9) - Amoxicillin doesn't work for him & he likes Levaquin 500mg  & keeps this on hand.  Hx of BRONCHITIS (ICD-490) - he likes to keep Rx for Levaquin on hand for bronchitic infections... ~  CXR 3/11 showed clear lungs, NAD.Marland Kitchen. ~  CXR 3/12 showed heart at upper lim normal, lungs clear, NAD.Marland Kitchen. ~  CXR 5/13 showed heart at upper lim normal, tortuous Ao, clear lungs, ?left nipple shadow noted==> repeat film w/ nipple marker confirmed.  HYPERTENSION >> he has borderline HBP readings in past w/ 150/90 as the high & mostly 130s/80s... ~  2012:  We decided to start therapy w/ AMLODIPINE 5mg  & up-titrated to 10mg /d by TP; he reports home BPs 130s/80s... ~  5/13:  BP= 152/92 & he  reports asymptomatic w/o CP, palpit, dizzy, SOB, edema, etc...  Hx of ABNORMAL ELECTROCARDIOGRAM (ICD-794.31) - his baseline EKG's have shown benign early repolarization changes and increased voltage... no change over the last 61yrs... 2DEcho 1999 showed normal heart chambers & valves- trivial MR, EF=60%... as noted he exercises regularly & still runs marathons... ~  EKG 3/11 showed NSR, rate 62, increased voltage and ?early repol, no acute changes... ~  EKG 3/12 showed NSR, rate67, increased voltage & ?early repol, NAD... ~  EKG 5/13 showed NSR, rate64, increased voltage, ?early repol, no change from old tracings...  HYPERCHOLESTEROLEMIA, BORDERLINE (ICD-272.4) - on diet alone, normal weight, good athlete... ~  FLP 4/08 showed TChol 173, TG 107, HDL 42, LDL 109 ~  FLP 7/09 showed TChol 193, TG 105, HDL 48, LDL 124 ~  FLP 3/11 showed TChol 201, TG 54, HDL 69, LDL 125 ~  FLP 3/12 showed TChol 204, TG 30, HDL 72, LDL 121 ~  FLP 5/13 showed TChol 197, TG 49, HDL 64, LDL 123  DIVERTICULOSIS OF COLON (ICD-562.10) & COLONIC POLYPS (ICD-211.3) - he is friends w/ DrGOrr and colonoscopy 10/03 by him was neg x for sm hems... several 2-89mm polyps removed in 2000 by DrKaplan who also noted a few divertics... path= 3 hyperpl polyps & one tubular adenoma... f/u colon was done by DrOrr  8/09 before he moved to [facility] according to the pt- we don't have copy, pt reports that it was OK...  BPH >> exam shows enlarged smooth gland & pt denies LTOS etc... ~  PSAs had shown a slow steady rising pattern w/o increased velocity... ~  Labs 3/12 showed PSA= 2.93 ~  Labs 5/13 showed PSA= 2.73... He notes some ED symptoms & we wrote for CIALIS 20mg  prn.  Hx of HEADACHE (ICD-784.0)  Hx of ANXIETY DEPRESSION (ICD-300.4) - prev treated w/ Paxil yrs ago... no recurrent problems...  Hx of LENTIGO (ICD-709.09) - Derm f/u DrTafeen...  Health Maintenance: ~  GI:  colon per DrOrr 8/09 reported by the pt to be "OK"... f/u  due 42yrs due to hx polyps. ~  GU:  CPX 5/13 showed DRE= sl enlarged diffuse gland, & PSA is steady ay 2.73 ~  Immunization:  he refuses Flu vaccines;  had PNEUMOVAX in 2000 at age 6;  had Tetanus shot 2000 & we gave TDAP 3/11;  Shingles Vaccine Rx written 5/13.   Past Medical History  Diagnosis Date  . Allergic rhinitis   . Sinusitis   . Bronchitis   . Nonspecific abnormal electrocardiogram (ECG) (EKG)   . Diverticulosis of colon (without mention of hemorrhage)   . Benign neoplasm of colon   . Headache   . Dysthymic disorder   . Lentigo     History reviewed. No pertinent past surgical history.   Outpatient Encounter Prescriptions as of 12/02/2012  Medication Sig Dispense Refill  . amLODipine (NORVASC) 10 MG tablet Take 1 tablet (10 mg total) by mouth daily.  90 tablet  3  . Calcium Carbonate-Vitamin D (CALCIUM + D PO) Take by mouth daily.      . fish oil-omega-3 fatty acids 1000 MG capsule Take 2 g by mouth daily.      Marland Kitchen losartan (COZAAR) 100 MG tablet Take 1 tablet (100 mg total) by mouth daily.  90 tablet  3  . Multiple Vitamins-Minerals (MULTIVITAMIN PO) Take by mouth daily.      . Nutritional Supplements (JOINT FORMULA PO) Take by mouth daily.      . tadalafil (CIALIS) 20 MG tablet Take 1 tablet (20 mg total) by mouth daily as needed for erectile dysfunction.  10 tablet  11   No facility-administered encounter medications on file as of 12/02/2012.    No Known Allergies   Current Medications, Allergies, Past Medical History, Past Surgical History, Family History, and Social History were reviewed in [employer] record.   Review of Systems Constitutional:   No  weight loss, night sweats,  Fevers, chills, fatigue, or  lassitude.  HEENT:   No headaches,  Difficulty swallowing,  Tooth/dental problems, or  Sore throat,                No sneezing, itching,   +nasal congestion, post nasal drip,   CV:  No chest pain,  Orthopnea, PND, swelling in lower  extremities, anasarca, dizziness, palpitations, syncope.   GI  No heartburn, indigestion, abdominal pain, nausea, vomiting, diarrhea, change in bowel habits, loss of appetite, bloody stools.   Resp:   No coughing up of blood.  No change in color of mucus.  No wheezing.  No chest wall deformity  Skin: no rash or lesions.  GU: no dysuria, change in color of urine, no urgency or frequency.  No flank pain, no hematuria   MS: No joint swelling.  + decreased range of motion.  +  back pain.  Psych:  No change in mood or affect. No depression or anxiety.  No memory loss.         Objective:   Physical Exam     GEN: A/Ox3; pleasant , NAD, well nourished   HEENT:  [place]/AT,  EACs-clear, TMs-wnl, NOSE-clear, THROAT-clear, no lesions, no postnasal drip or exudate noted.   NECK:  Supple w/ fair ROM; no JVD; normal carotid impulses w/o bruits; no thyromegaly or nodules palpated; no lymphadenopathy.  RESP  Clear  P & A; w/o, wheezes/ rales/ or rhonchi.no accessory muscle use, no dullness to percussion  CARD:  RRR, no m/r/g  , no peripheral edema, pulses intact, no cyanosis or clubbing.  GI:   Soft & nt; nml bowel sounds; no organomegaly or masses detected.  Musco: Warm bil, no deformities or joint swelling noted.  Tender along right mid back. Neg SLR, nml /equal strength,  DTR 2 + , nml grips. nml gait.    Neuro: alert, no focal deficits noted.    Skin: Warm, no lesions or rashes    Assessment & Plan:

## 2012-12-02 NOTE — Patient Instructions (Addendum)
Celebrex 200mg  daily for 7 days -take with food Skelaxin 800mg  Three times a day  As needed  Muscle spasm  Alternate ice and heat to area  Push fluids  Please contact office for sooner follow up if symptoms do not improve or worsen or seek emergency care   If not improving in 2 weeks will need referral to orthopedics -call back as needed.

## 2012-12-02 NOTE — Assessment & Plan Note (Signed)
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  Advised pt if not improving will need further evaluation  rec orthopedist as he has a very high impact hobby that has potential for repetitive injury  Plan  Celebrex 200mg  daily for 7 days -take with food Skelaxin 800mg  Three times a day  As needed  Muscle spasm  Alternate ice and heat to area  Push fluids  Please contact office for sooner follow up if symptoms do not improve or worsen or seek emergency care   If not improving in 2 weeks will need referral to orthopedics -call back as needed.

## 2013-01-24 ENCOUNTER — Other Ambulatory Visit: Payer: Self-pay | Admitting: Pulmonary Disease

## 2013-02-17 ENCOUNTER — Ambulatory Visit (INDEPENDENT_AMBULATORY_CARE_PROVIDER_SITE_OTHER): Payer: BC Managed Care – PPO | Admitting: Pulmonary Disease

## 2013-02-17 ENCOUNTER — Encounter: Payer: Self-pay | Admitting: Pulmonary Disease

## 2013-02-17 ENCOUNTER — Other Ambulatory Visit (INDEPENDENT_AMBULATORY_CARE_PROVIDER_SITE_OTHER): Payer: BC Managed Care – PPO

## 2013-02-17 VITALS — BP 120/72 | HR 66 | Temp 97.0°F | Ht 69.0 in | Wt 159.0 lb

## 2013-02-17 DIAGNOSIS — Z0000 Encounter for general adult medical examination without abnormal findings: Secondary | ICD-10-CM

## 2013-02-17 DIAGNOSIS — E785 Hyperlipidemia, unspecified: Secondary | ICD-10-CM

## 2013-02-17 DIAGNOSIS — I10 Essential (primary) hypertension: Secondary | ICD-10-CM

## 2013-02-17 DIAGNOSIS — J309 Allergic rhinitis, unspecified: Secondary | ICD-10-CM

## 2013-02-17 DIAGNOSIS — D126 Benign neoplasm of colon, unspecified: Secondary | ICD-10-CM

## 2013-02-17 LAB — URINALYSIS
Bilirubin Urine: NEGATIVE
Leukocytes, UA: NEGATIVE
Nitrite: NEGATIVE
Specific Gravity, Urine: 1.015 (ref 1.000–1.030)
pH: 8 (ref 5.0–8.0)

## 2013-02-17 LAB — CBC WITH DIFFERENTIAL/PLATELET
Eosinophils Relative: 2.7 % (ref 0.0–5.0)
Lymphocytes Relative: 52 % — ABNORMAL HIGH (ref 12.0–46.0)
Monocytes Relative: 9.5 % (ref 3.0–12.0)
Neutrophils Relative %: 34.8 % — ABNORMAL LOW (ref 43.0–77.0)
Platelets: 279 10*3/uL (ref 150.0–400.0)
WBC: 5.1 10*3/uL (ref 4.5–10.5)

## 2013-02-17 LAB — BASIC METABOLIC PANEL
BUN: 19 mg/dL (ref 6–23)
Chloride: 100 mEq/L (ref 96–112)
Creatinine, Ser: 0.9 mg/dL (ref 0.4–1.5)
Glucose, Bld: 103 mg/dL — ABNORMAL HIGH (ref 70–99)
Potassium: 4.4 mEq/L (ref 3.5–5.1)

## 2013-02-17 LAB — LIPID PANEL
Cholesterol: 198 mg/dL (ref 0–200)
Triglycerides: 46 mg/dL (ref 0.0–149.0)
VLDL: 9.2 mg/dL (ref 0.0–40.0)

## 2013-02-17 LAB — HEPATIC FUNCTION PANEL
ALT: 22 U/L (ref 0–53)
AST: 25 U/L (ref 0–37)
Albumin: 4.2 g/dL (ref 3.5–5.2)

## 2013-02-17 LAB — TSH: TSH: 1.47 u[IU]/mL (ref 0.35–5.50)

## 2013-02-17 MED ORDER — CHLORZOXAZONE 500 MG PO TABS: 500.0000 mg | ORAL_TABLET | Freq: Three times a day (TID) | ORAL | Status: DC | PRN

## 2013-02-17 MED ORDER — LEVOFLOXACIN 500 MG PO TABS: 500.0000 mg | ORAL_TABLET | Freq: Every day | ORAL | Status: DC

## 2013-02-17 NOTE — Patient Instructions (Addendum)
Today we updated your med list in our EPIC system...    Continue your current medications the same...  We decided to change the Skelaxin to Parafon forte to see if this works any better for you...  Today we did your follow up FASTING blood work...     We will contact you w/ the results when available...   If needed we would be happy to refer you to Dr. Roanna Epley for Sports Medicine eval...  Call for any questions...  Let's plan a follow up visit in 80yr, sooner if needed for problems.Marland KitchenMarland Kitchen

## 2013-02-17 NOTE — Progress Notes (Addendum)
Subjective:    Patient ID: Travis Bell, male    DOB: 07/10/1946, 67 y.o.   MRN: [id number]  HPI 67 y/o WM here for a follow up visit... He has mult medical problems including:  Hx Sinusitis/ Bronchitis/ Allergies;  Mild Hyperchol;  Divertics/ Colon Polyps;  Hx Lentigo skin lesion...  ~  December 28, 2010:  Yearly ROV & CPX doing well- he has cut back sl on his running due to his knees but still exerc regularly & works out in [employer], no new complaints or concerns...    HBP> He was started on Norvasc5 & up-titrated to 10mg /d w/ BP improved to 130.80 range; rec continue same + low sodium etc...    Sinusitis/ Bronchitis>  Doing well overall but requests Levaquin to keep on hand for the occas sinus infection...    Chol>  LDL not at goal but he prefers diet Rx;  Denies CP, palpit, dizzy, SOB, edema, etc;  Today's FLP ==> TChol 204, TG 30, HDL 72, LDL 121;  rec to continue diet, exercise, etc...    GI>  Hx divertics & prev polyps, last colon 2009 by DrOrr w/ one tub adenoma removed, due for f/u colon in 2014 & requests DrKaplan.    Others>  Prob list reviewed- denies headaches, skin lesions, feeling well overall, etc...  ~  Feb 11, 2012:  Yearly ROV & Freedom is feeling well- good year, no new complaints or concerns;  He has been on Norvasc10mg /d & BP at home doing well at home he says, remains active, exercising regularly & no CP/ palpit/ SOB/ etc;  We reviewed prob list, meds, xrays & labs> see below>> CXR 5/13 showed heart at upper lim normal, tortuous Ao, clear lungs, ?left nipple shadow noted... repeat film w/ nipple marker confirmed. EKG 5/13 showed NSR, rate64, increased voltage, ?early repol ==> we will recheck his 2DEcho. LABS 5/13:  FLP- ok x LDL=123;  Chems- ok x BS=107;  CBC- wnl;  TSH=1.78;  PSA=2.73 2DEcho 7/13 => showed mild LVH, normal wall motion & EF=55-60%, Gr1DD, mild MR&AI... We decided to add LOSARTAN100mg /d.  ~  Feb 17, 2013:  Yearly ROV & CPX>  Tanav continues to do well but notes  some back discomfort after running; he has maintained an active running schedule & exercise program- we discussed this but at his age I have rec that he take it a little easier than usual or consider SportsMed consult w/ Dr. Roanna Epley... Rx for Parafon Forte prn muscle spasm... We reviewed the following medical problems during today's office visit >>    Sinusitis/ Bronchitis>  Doing well overall but requests Levaquin to keep on hand for the occas sinus infection...    HBP> on Norvasc10 & Cozaar100;  BP= 120/72 and he denies HA, CP, palpit, SOB, edema, etc...    Chol> on FishOil; FLP 5/14 shows TChol 198, TG 46, HDL 70, LDL 118    GI>  Hx divertics & prev polyps, last colon 2009 by DrOrr w/ one tub adenoma removed, due for f/u colon in 2014 & requests DrKaplan.    GU- BPH> labs 5/14 shows PSA=3.91 w/ incr PSA velocity & we discussed recheck in 6 mo...    LBP> pt is very active & still runs, c/o LBP intermittently & we rec consult w/ DrBertFields, try Parafon Forte prn...    Others>  Prob list reviewed- denies headaches, skin lesions, feeling well overall, etc... We reviewed prob list, meds, xrays and labs> see below for updates >>  LABS 5/14:  FLP- at goals x LDL=118;  Chems- wnl;  CBC- ok w/ Hg=12.5 MCV=84;  TSH=1.47;  PSA=3.91... He is asked ret for Iron studies, stool cards now but doesn't want to repeat colonoscopy until he turns 67... ADDENDUM>> f/u labs 03/05/13 showed Hg=12.5 MCV=85, Fe=48 & sat=9%.Marland KitchenMarland Kitchen I rec GI consult w/ DrKaplan now...         Problem List:    ALLERGIC RHINITIS (ICD-477.9) >> he uses OTC antihist as needed...  Hx of SINUSITIS (ICD-473.9) - Amoxicillin doesn't work for him & he likes Levaquin 500mg  & keeps this on hand.  Hx of BRONCHITIS (ICD-490) - he likes to keep Rx for Levaquin on hand for bronchitic infections... ~  CXR 3/11 showed clear lungs, NAD.Marland Kitchen. ~  CXR 3/12 showed heart at upper lim normal, lungs clear, NAD.Marland Kitchen. ~  CXR 5/13 showed heart at upper lim normal,  tortuous Ao, clear lungs, ?left nipple shadow noted==> repeat film w/ nipple marker confirmed.  HYPERTENSION >> he has borderline HBP readings in past w/ 150/90 as the high & mostly 130s/80s... ~  2012:  We decided to start therapy w/ AMLODIPINE 5mg  & up-titrated to 10mg /d by TP; he reports home BPs 130s/80s... ~  5/13:  BP= 152/92 & he reports asymptomatic w/o CP, palpit, dizzy, SOB, edema, etc... ~  2DEcho 7/13 showed mild LVH, normal wall motion & EF=55-60%, Gr1DD, mild MR&AI... ~  5/14: on Norvasc10 & Cozaar100;  BP= 120/72 and he denies HA, CP, palpit, SOB, edema, etc.  Hx of ABNORMAL ELECTROCARDIOGRAM (ICD-794.31) - his baseline EKG's have shown benign early repolarization changes and increased voltage... no change over the last 29yrs... 2DEcho 1999 showed normal heart chambers & valves- trivial MR, EF=60%... as noted he exercises regularly & still runs marathons... ~  EKG 3/11 showed NSR, rate 62, increased voltage and ?early repol, no acute changes... ~  EKG 3/12 showed NSR, rate67, increased voltage & ?early repol, NAD... ~  EKG 5/13 showed NSR, rate64, increased voltage, ?early repol, no change from old tracings...  HYPERCHOLESTEROLEMIA, BORDERLINE (ICD-272.4) - on diet alone, normal weight, good athlete... ~  FLP 4/08 showed TChol 173, TG 107, HDL 42, LDL 109 ~  FLP 7/09 showed TChol 193, TG 105, HDL 48, LDL 124 ~  FLP 3/11 showed TChol 201, TG 54, HDL 69, LDL 125 ~  FLP 3/12 showed TChol 204, TG 30, HDL 72, LDL 121 ~  FLP 5/13 showed TChol 197, TG 49, HDL 64, LDL 123 ~  FLP 5/14 showed TChol 198, TG 46, HDL 70, LDL 118  DIVERTICULOSIS OF COLON (ICD-562.10) & COLONIC POLYPS (ICD-211.3) - he is friends w/ DrGOrr and colonoscopy 10/03 by him was neg x for sm hems... several 2-84mm polyps removed in 2000 by DrKaplan who also noted a few divertics... path= 3 hyperpl polyps & one tubular adenoma... f/u colon was done by DrOrr 8/09 before he moved to [facility] according to the pt- we  don't have copy, pt reports that it was OK...  BPH >> exam shows enlarged smooth gland & pt denies LTOS etc... ~  PSAs had shown a slow steady rising pattern w/o increased velocity... ~  Labs 3/12 showed PSA= 2.93 ~  Labs 5/13 showed PSA= 2.73... He notes some ED symptoms & we wrote for CIALIS 20mg  prn. ~  Labs 5/14 showed PSA= 3.91... Increased PSA velocity 7 rec to repeat lab in 3mo...  Hx of HEADACHE (ICD-784.0)  Hx of ANXIETY DEPRESSION (ICD-300.4) - prev treated w/ Paxil yrs  ago... no recurrent problems...  Hx of LENTIGO (ICD-709.09) - Derm f/u DrTafeen...  Health Maintenance: ~  GI:  colon per DrOrr 8/09 reported by the pt to be "OK"... f/u due 23yrs due to hx polyps. ~  GU:  CPX 5/13 showed DRE= sl enlarged diffuse gland, & PSA is steady ay 2.73 ~  Immunization:  he refuses Flu vaccines;  had PNEUMOVAX in 2000 at age 20;  had Tetanus shot 2000 & we gave TDAP 3/11;  Shingles Vaccine Rx written 5/13.   Past Medical History  Diagnosis Date  . Allergic rhinitis   . Sinusitis   . Bronchitis   . Nonspecific abnormal electrocardiogram (ECG) (EKG)   . Diverticulosis of colon (without mention of hemorrhage)   . Benign neoplasm of colon   . Headache   . Dysthymic disorder   . Lentigo     No past surgical history on file.   Outpatient Encounter Prescriptions as of 02/17/2013  Medication Sig Dispense Refill  . amLODipine (NORVASC) 10 MG tablet TAKE 1 TABLET DAILY  90 tablet  2  . Calcium Carbonate-Vitamin D (CALCIUM + D PO) Take by mouth daily.      . fish oil-omega-3 fatty acids 1000 MG capsule Take 2 g by mouth daily.      Marland Kitchen losartan (COZAAR) 100 MG tablet Take 1 tablet (100 mg total) by mouth daily.  90 tablet  3  . metaxalone (SKELAXIN) 800 MG tablet Take 1 tablet (800 mg total) by mouth 3 (three) times daily as needed (muscle spasm).  30 tablet  0  . Multiple Vitamins-Minerals (MULTIVITAMIN PO) Take by mouth daily.      . Nutritional Supplements (JOINT FORMULA PO) Take by  mouth daily.      . tadalafil (CIALIS) 20 MG tablet Take 1 tablet (20 mg total) by mouth daily as needed for erectile dysfunction.  10 tablet  11   No facility-administered encounter medications on file as of 02/17/2013.    No Known Allergies   Current Medications, Allergies, Past Medical History, Past Surgical History, Family History, and Social History were reviewed in [employer] record.   Review of Systems    The patient denies fever, chills, sweats, anorexia, fatigue, weakness, malaise, weight loss, sleep disorder, blurring, diplopia, eye irritation, eye discharge, vision loss, eye pain, photophobia, earache, ear discharge, tinnitus, decreased hearing, nasal congestion, nosebleeds, sore throat, hoarseness, chest pain, palpitations, syncope, dyspnea on exertion, orthopnea, PND, peripheral edema, cough, dyspnea at rest, excessive sputum, hemoptysis, wheezing, pleurisy, nausea, vomiting, diarrhea, constipation, change in bowel habits, abdominal pain, melena, hematochezia, jaundice, gas/bloating, indigestion/heartburn, dysphagia, odynophagia, dysuria, hematuria, urinary frequency, urinary hesitancy, nocturia, incontinence, back pain, joint pain, joint swelling, muscle cramps, muscle weakness, stiffness, arthritis, sciatica, restless legs, leg pain at night, leg pain with exertion, rash, itching, dryness, suspicious lesions, paralysis, paresthesias, seizures, tremors, vertigo, transient blindness, frequent falls, frequent headaches, difficulty walking, depression, anxiety, memory loss, confusion, cold intolerance, heat intolerance, polydipsia, polyphagia, polyuria, unusual weight change, abnormal bruising, bleeding, enlarged lymph nodes, urticaria, allergic rash, hay fever, and recurrent infections.     Objective:   Physical Exam     WD, WN, 67 y/o WM in NAD... GENERAL:  Alert & oriented; pleasant & cooperative... HEENT:  [place]/AT, EOM-wnl, PERRLA, Fundi-benign, EACs-clear,  TMs-wnl, NOSE-clear, THROAT-clear & wnl. NECK:  Supple w/ full ROM; no JVD; normal carotid impulses w/o bruits; no thyromegaly or nodules palpated; no lymphadenopathy. CHEST:  Clear to P & A; without wheezes/ rales/ or rhonchi  heard... HEART:  Regular Rhythm; without murmurs/ rubs/ or gallops detected... ABDOMEN:  Soft & nontender; normal bowel sounds; no organomegaly or masses palpated... EXT: without deformities or arthritic changes; no varicose veins/ venous insuffic/ or edema... NEURO:  CN's intact; motor testing normal; sensory testing normal; gait normal & balance OK. DERM:  No lesions noted; no rash etc...  RADIOLOGY DATA:  Reviewed in the EPIC EMR & discussed w/ the patient...  LABORATORY DATA:  Reviewed in the EPIC EMR & discussed w/ the patient...   Assessment & Plan:    CPX>>    AR/Hx Sinusitis>  He likes to keep Levaquin 500mg  Rx on hand for sinus infections etc...  HBP>  BP improved on Amlod & Losartan- continue same...  ABN EKG>  He has increased voltage, ?early repol, no changes; & 2DEcho 7/13 w/ mild LVH...  CHOL>  His LDL has been in the 120 range but other parameters are wnl; continue diet Rx...  GI> Divertics, Polyps>  He is a personal friend of DrOrrs; last colon 8/09 & f/u due 5 yrs...  GU> BPH, mild ED>  He denies LTOS but prostate is big on DRE; PSA 5/14 is 3.91 w/ incr PSA velocity- recheck 61mo,  Cialis for ED complaints...  Anxiety>  He is stable w/o need for meds...  Hx Lentigo>  Followed by Mikey Kirschner...   Patient's Medications  New Prescriptions   CHLORZOXAZONE (PARAFON) 500 MG TABLET    Take 1 tablet (500 mg total) by mouth 3 (three) times daily as needed for muscle spasms.   LEVOFLOXACIN (LEVAQUIN) 500 MG TABLET    Take 1 tablet (500 mg total) by mouth daily.  Previous Medications   AMLODIPINE (NORVASC) 10 MG TABLET    TAKE 1 TABLET DAILY   CALCIUM CARBONATE-VITAMIN D (CALCIUM + D PO)    Take by mouth daily.   FISH OIL-OMEGA-3 FATTY ACIDS  1000 MG CAPSULE    Take 2 g by mouth daily.   LOSARTAN (COZAAR) 100 MG TABLET    Take 1 tablet (100 mg total) by mouth daily.   MULTIPLE VITAMINS-MINERALS (MULTIVITAMIN PO)    Take by mouth daily.   NUTRITIONAL SUPPLEMENTS (JOINT FORMULA PO)    Take by mouth daily.   TADALAFIL (CIALIS) 20 MG TABLET    Take 1 tablet (20 mg total) by mouth daily as needed for erectile dysfunction.  Modified Medications   No medications on file  Discontinued Medications   METAXALONE (SKELAXIN) 800 MG TABLET    Take 1 tablet (800 mg total) by mouth 3 (three) times daily as needed (muscle spasm).

## 2013-02-28 ENCOUNTER — Other Ambulatory Visit: Payer: Self-pay | Admitting: Pulmonary Disease

## 2013-03-01 ENCOUNTER — Telehealth: Payer: Self-pay | Admitting: Pulmonary Disease

## 2013-03-01 DIAGNOSIS — D649 Anemia, unspecified: Secondary | ICD-10-CM

## 2013-03-01 NOTE — Telephone Encounter (Signed)
Notes Recorded by Michele Mcalpine, MD on 02/18/2013 at 3:26 PM Please notify patient>  FLP is ok on diet alone> HDL is hi at 70, LDL=118 (GNFA<213) Rec continued diet + exercise... Chems are ok/ WNL.Marland KitchenMarland Kitchen Thyroid is WNL.Marland KitchenMarland Kitchen UA is clear... CBC w/ borderline anemia & we need to bring him back to lab for recheck CBC, Fe panel & do stool cards now... PSA is upper lim of normal & needs recheck in 32mo instead of waiting 40yr... ----  I spoke with patient about results and he verbalized understanding and had no questions. Stool card placed upfront for pick up.

## 2013-03-05 ENCOUNTER — Other Ambulatory Visit (INDEPENDENT_AMBULATORY_CARE_PROVIDER_SITE_OTHER): Payer: BC Managed Care – PPO

## 2013-03-05 DIAGNOSIS — D649 Anemia, unspecified: Secondary | ICD-10-CM

## 2013-03-05 LAB — CBC WITH DIFFERENTIAL/PLATELET
Basophils Absolute: 0 10*3/uL (ref 0.0–0.1)
Eosinophils Absolute: 0.2 10*3/uL (ref 0.0–0.7)
Hemoglobin: 12.5 g/dL — ABNORMAL LOW (ref 13.0–17.0)
Lymphocytes Relative: 52.8 % — ABNORMAL HIGH (ref 12.0–46.0)
MCHC: 33.2 g/dL (ref 30.0–36.0)
Monocytes Relative: 10.1 % (ref 3.0–12.0)
Neutrophils Relative %: 33.4 % — ABNORMAL LOW (ref 43.0–77.0)
RBC: 4.46 Mil/uL (ref 4.22–5.81)
RDW: 15.3 % — ABNORMAL HIGH (ref 11.5–14.6)

## 2013-03-05 LAB — IBC PANEL
Saturation Ratios: 8.5 % — ABNORMAL LOW (ref 20.0–50.0)
Transferrin: 404.1 mg/dL — ABNORMAL HIGH (ref 212.0–360.0)

## 2013-03-08 ENCOUNTER — Telehealth: Payer: Self-pay | Admitting: Pulmonary Disease

## 2013-03-08 DIAGNOSIS — D126 Benign neoplasm of colon, unspecified: Secondary | ICD-10-CM

## 2013-03-08 DIAGNOSIS — K5730 Diverticulosis of large intestine without perforation or abscess without bleeding: Secondary | ICD-10-CM

## 2013-03-08 NOTE — Telephone Encounter (Signed)
Called and spoke with pt and he is aware of SN recs of lab results and is aware that we will set up appt with Dr. Arlyce Dice for further eval.

## 2013-03-09 ENCOUNTER — Encounter: Payer: Self-pay | Admitting: Gastroenterology

## 2013-03-09 ENCOUNTER — Telehealth: Payer: Self-pay | Admitting: Pulmonary Disease

## 2013-03-09 NOTE — Telephone Encounter (Signed)
Called and spoke with pt and he stated that he was confused about why he needed to go and see GI before oct or nov.  He stated that he made SN aware that he will be due for his next colon at that time and would prefer to wait until then if possible, but if SN feels that he needs to go in July (has appt with Dr. Arlyce Dice on 7-11) then he will keep this appt.  Pt stated that he will get the stool cards done and turned in.  SN please advise. Thanks  No Known Allergies

## 2013-03-09 NOTE — Telephone Encounter (Signed)
Pt aware of advice from SN. Will keep OV with Dr Arlyce Dice and discuss with him.

## 2013-03-09 NOTE — Telephone Encounter (Signed)
Per SN---  Yes to keep the appt with Dr. Arlyce Dice in July due to the borderline anemia and low iron.  Talk to Dr. Arlyce Dice at appt and see if he agrees to hold off on the colonoscopy until oct when pt wanted to have this done.

## 2013-03-15 ENCOUNTER — Encounter: Payer: Self-pay | Admitting: Gastroenterology

## 2013-03-23 ENCOUNTER — Other Ambulatory Visit (INDEPENDENT_AMBULATORY_CARE_PROVIDER_SITE_OTHER): Payer: BC Managed Care – PPO

## 2013-03-23 ENCOUNTER — Other Ambulatory Visit: Payer: Self-pay | Admitting: Pulmonary Disease

## 2013-03-23 DIAGNOSIS — D126 Benign neoplasm of colon, unspecified: Secondary | ICD-10-CM

## 2013-03-23 DIAGNOSIS — Z0000 Encounter for general adult medical examination without abnormal findings: Secondary | ICD-10-CM

## 2013-03-23 LAB — HEMOCCULT SLIDES (X 3 CARDS): OCCULT 1: NEGATIVE

## 2013-04-07 ENCOUNTER — Encounter: Payer: Self-pay | Admitting: Gastroenterology

## 2013-04-07 ENCOUNTER — Ambulatory Visit (INDEPENDENT_AMBULATORY_CARE_PROVIDER_SITE_OTHER): Payer: BC Managed Care – PPO | Admitting: Gastroenterology

## 2013-04-07 VITALS — BP 130/80 | HR 80 | Ht 69.0 in | Wt 157.4 lb

## 2013-04-07 DIAGNOSIS — D649 Anemia, unspecified: Secondary | ICD-10-CM

## 2013-04-07 DIAGNOSIS — D126 Benign neoplasm of colon, unspecified: Secondary | ICD-10-CM

## 2013-04-07 NOTE — Patient Instructions (Addendum)
Follow up as needed

## 2013-04-07 NOTE — Assessment & Plan Note (Signed)
The patient has a mild, nonspecific anemia of unclear etiology.  Recent Hemoccults were negative. He is currently taking iron.  Recommendations #1 followup hemoglobin in 3 and 6 months. No routine GI studies at this time unless patient has evidence for GI blood loss. I agree with supplemental iron although this muddies the waters regarding the development of an iron deficiency anemia

## 2013-04-07 NOTE — Progress Notes (Signed)
History of Present Illness: Pleasant 67 year old white male referred at the request of Dr. Kriste Basque for evaluation of a drop in hemoglobin. In March, 2011 hemoglobin was 14.5. One year later he was 14.7. In may, 2013 it was 13.6 and in May and June, 2014 12.5. MCV remains normal. Recent iron was 48 and iron saturation 8.5.  He was started on iron last month. Stool Hemoccults x5 were negative in June, 2014. He has no GI complaints including change of bowel habits, abdominal pain, melena or hematochezia. An adenomatous polyp was removed in 2000. Colonoscopies in 2003 and 2009 were unremarkable.   Past Medical History  Diagnosis Date  . Allergic rhinitis   . Sinusitis   . Bronchitis   . Nonspecific abnormal electrocardiogram (ECG) (EKG)   . Diverticulosis of colon (without mention of hemorrhage)   . Benign neoplasm of colon   . Headache(784.0)   . Dysthymic disorder   . Lentigo   . Colon polyps   . HTN (hypertension)    History reviewed. No pertinent past surgical history. family history includes Heart disease in his father. Current Outpatient Prescriptions  Medication Sig Dispense Refill  . amLODipine (NORVASC) 10 MG tablet TAKE 1 TABLET DAILY  90 tablet  2  . Calcium Carbonate-Vitamin D (CALCIUM + D PO) Take by mouth daily.      . chlorzoxazone (PARAFON) 500 MG tablet Take 1 tablet (500 mg total) by mouth 3 (three) times daily as needed for muscle spasms.  30 tablet  5  . ferrous sulfate (FEOSOL) 325 (65 FE) MG tablet Take 325 mg by mouth daily with breakfast.      . fish oil-omega-3 fatty acids 1000 MG capsule Take 2 g by mouth daily.      Marland Kitchen losartan (COZAAR) 100 MG tablet TAKE 1 TABLET DAILY  90 tablet  2  . Multiple Vitamins-Minerals (MULTIVITAMIN PO) Take by mouth daily.      . Nutritional Supplements (JOINT FORMULA PO) Take by mouth daily.      . tadalafil (CIALIS) 20 MG tablet Take 1 tablet (20 mg total) by mouth daily as needed for erectile dysfunction.  10 tablet  11   No current  facility-administered medications for this visit.   Allergies as of 04/07/2013  . (No Known Allergies)    reports that he quit smoking about 35 years ago. His smoking use included Cigarettes. He has a 7.5 pack-year smoking history. He has never used smokeless tobacco. He reports that  drinks alcohol. He reports that he does not use illicit drugs.     Review of Systems: Pertinent positive and negative review of systems were noted in the above HPI section. All other review of systems were otherwise negative.  Vital signs were reviewed in today's medical record Physical Exam: General: Well developed , well nourished, no acute distress Skin: anicteric Head: Normocephalic and atraumatic Eyes:  sclerae anicteric, EOMI Ears: Normal auditory acuity Mouth: No deformity or lesions Neck: Supple, no masses or thyromegaly Lungs: Clear throughout to auscultation Heart: Regular rate and rhythm; no murmurs, rubs or bruits Abdomen: Soft, non tender and non distended. No masses, hepatosplenomegaly or hernias noted. Normal Bowel sounds Rectal:deferred Musculoskeletal: Symmetrical with no gross deformities  Skin: No lesions on visible extremities Pulses:  Normal pulses noted Extremities: No clubbing, cyanosis, edema or deformities noted Neurological: Alert oriented x 4, grossly nonfocal Cervical Nodes:  No significant cervical adenopathy Inguinal Nodes: No significant inguinal adenopathy Psychological:  Alert and cooperative. Normal mood and affect

## 2013-04-07 NOTE — Assessment & Plan Note (Signed)
Plan routine surveillance colonoscopy in 2019

## 2013-04-09 ENCOUNTER — Ambulatory Visit: Payer: BC Managed Care – PPO | Admitting: Gastroenterology

## 2013-05-22 IMAGING — CR DG CHEST 2V
2 series · 2 of 2 positions shown · non-contrast
Comparison: 12/28/2010

CLINICAL DATA: Hypertension, physical exam, former smoker

CHEST - 2 VIEW

[view not recorded (1 of 2)]
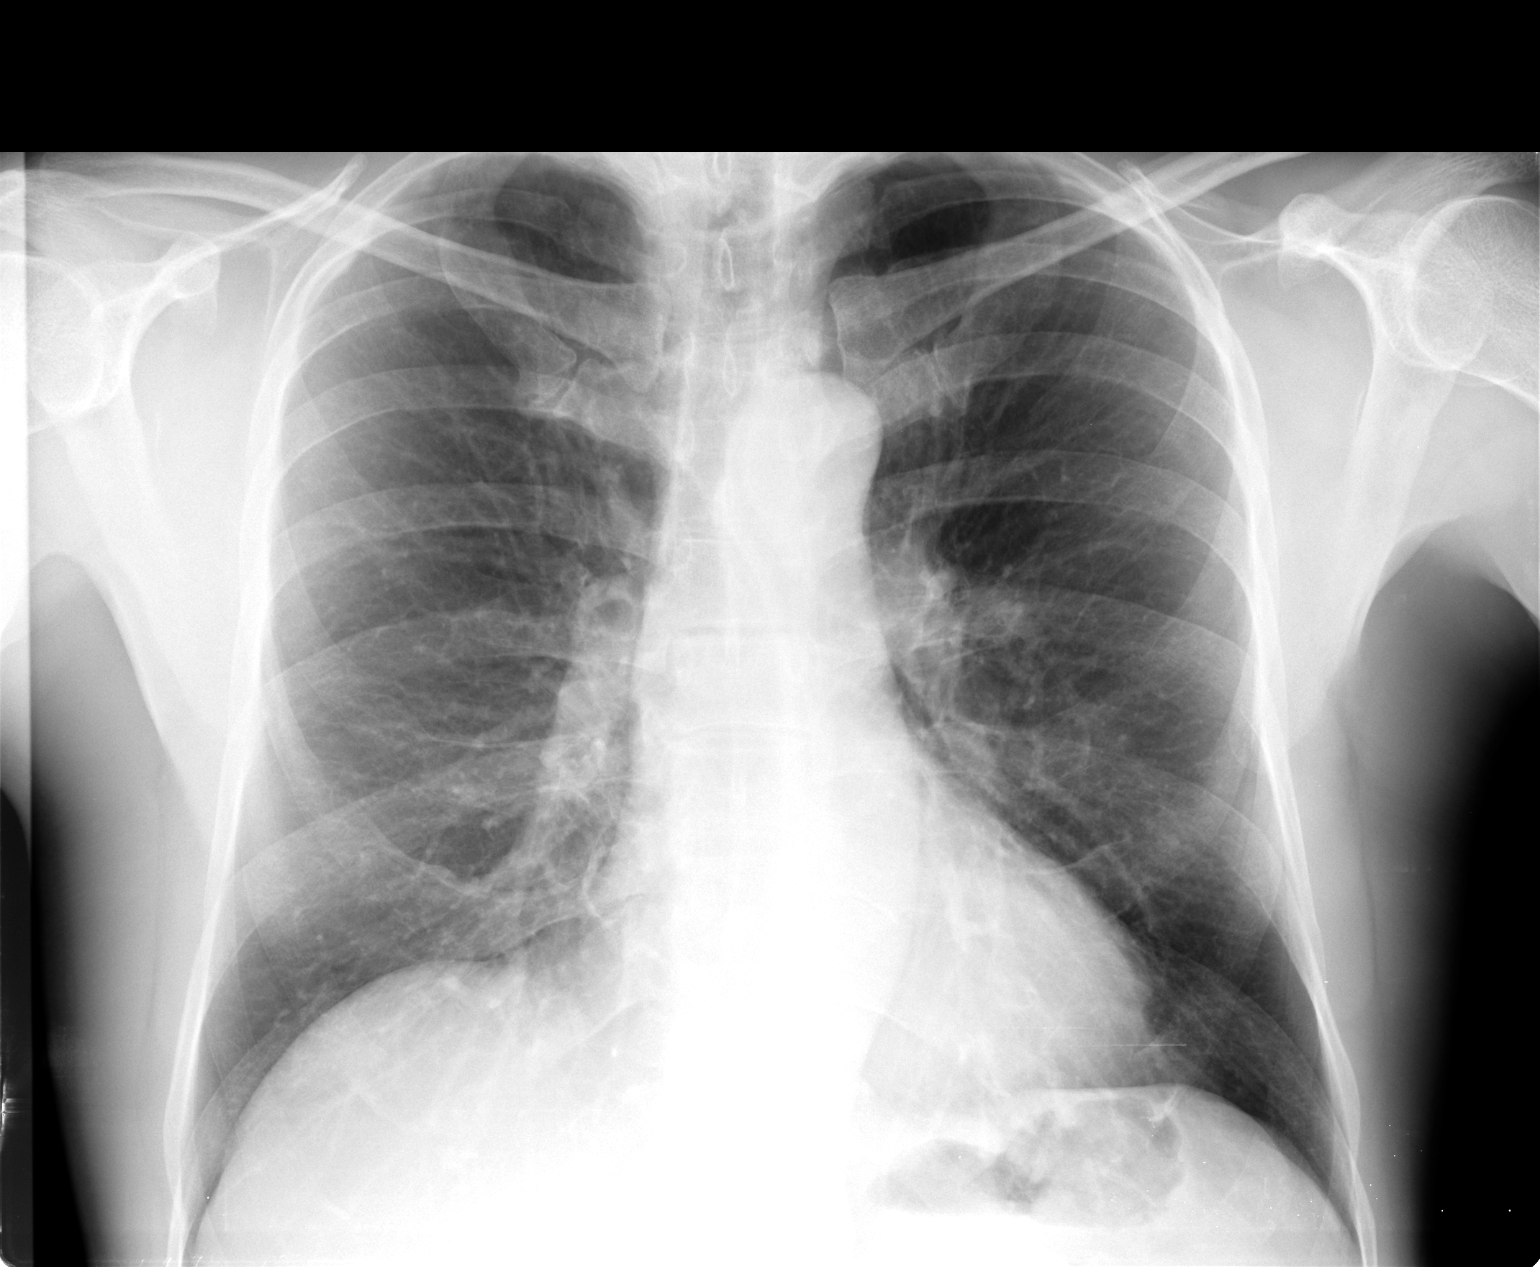

[view not recorded (2 of 2)]
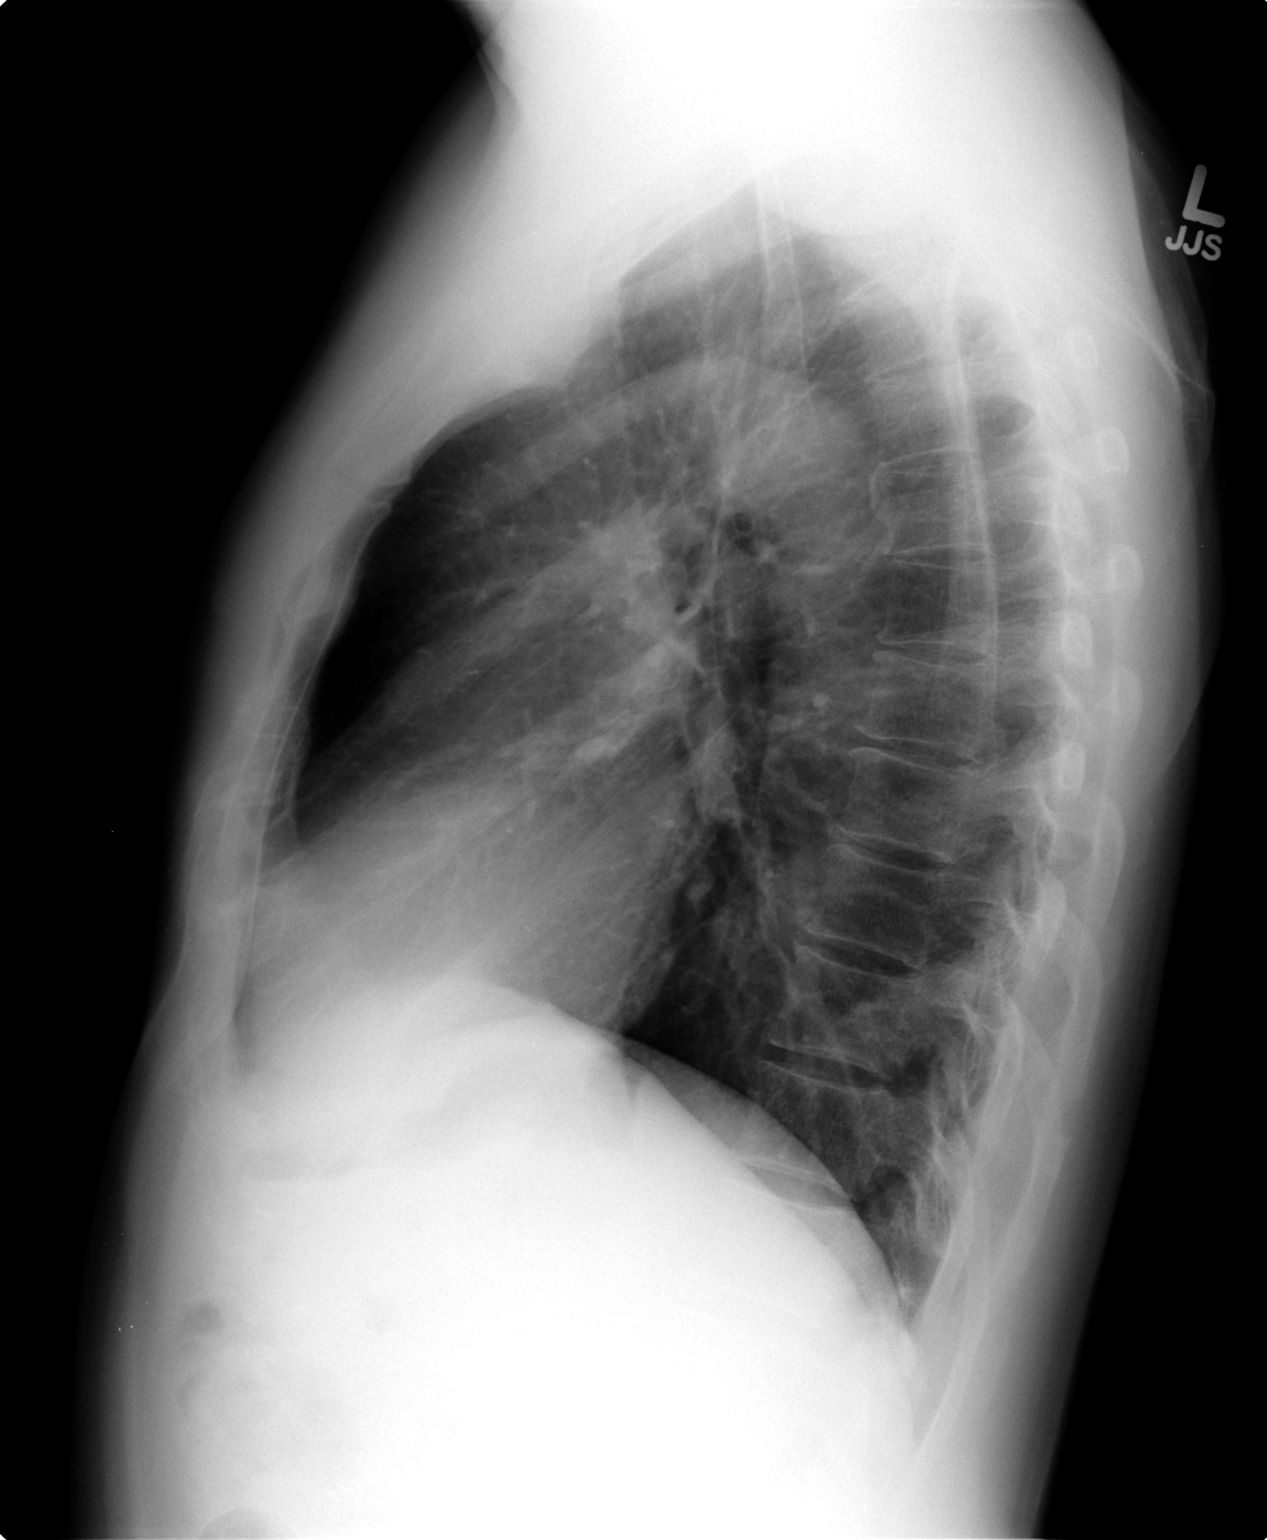

[2 of 2 positions shown; findings below may reference images not displayed]

FINDINGS: Upper-normal size of cardiac silhouette.
Tortuous aorta.
Pulmonary vascularity normal.
Lungs are clear.
No pleural effusion or pneumothorax.
Questionable faint nodular density in the lower left chest
potentially nipple shadow.
No acute osseous findings.
IMPRESSION: Question left nipple shadows; recommend repeat PA chest radiograph
with nipple markers to exclude pulmonary nodule.
Otherwise negative exam.

## 2013-09-13 ENCOUNTER — Telehealth: Payer: Self-pay | Admitting: Pulmonary Disease

## 2013-09-13 NOTE — Telephone Encounter (Signed)
lmomtcb---need to find out if he wants these rx for 90 day supply or 30 day supply.

## 2013-09-14 MED ORDER — AMLODIPINE BESYLATE 10 MG PO TABS: 10.0000 mg | ORAL_TABLET | Freq: Every day | ORAL | Status: DC

## 2013-09-14 MED ORDER — LOSARTAN POTASSIUM 100 MG PO TABS: 100.0000 mg | ORAL_TABLET | Freq: Every day | ORAL | Status: DC

## 2013-09-14 NOTE — Telephone Encounter (Signed)
Pt is aware that 90 day supply of his medications have been sent in.

## 2013-09-14 NOTE — Telephone Encounter (Signed)
Patient returning call.

## 2014-01-04 ENCOUNTER — Telehealth: Payer: Self-pay | Admitting: Pulmonary Disease

## 2014-01-04 NOTE — Telephone Encounter (Signed)
Called and spoke with pt and he is aware of SN and the primary care issues.  He is aware that we will not be able to set him up with new primary care until sept at the [place] office.  Pt wanted to be seen before then since he is due for his CPX>  Pt placed on SN schedule for 4-16.  Pt is aware

## 2014-01-12 ENCOUNTER — Encounter: Payer: Self-pay | Admitting: Pulmonary Disease

## 2014-01-12 ENCOUNTER — Telehealth: Payer: Self-pay | Admitting: Pulmonary Disease

## 2014-01-12 ENCOUNTER — Other Ambulatory Visit (INDEPENDENT_AMBULATORY_CARE_PROVIDER_SITE_OTHER): Payer: No Typology Code available for payment source

## 2014-01-12 ENCOUNTER — Ambulatory Visit (INDEPENDENT_AMBULATORY_CARE_PROVIDER_SITE_OTHER): Payer: No Typology Code available for payment source | Admitting: Pulmonary Disease

## 2014-01-12 ENCOUNTER — Encounter (INDEPENDENT_AMBULATORY_CARE_PROVIDER_SITE_OTHER): Payer: Self-pay

## 2014-01-12 ENCOUNTER — Ambulatory Visit (INDEPENDENT_AMBULATORY_CARE_PROVIDER_SITE_OTHER)
Admission: RE | Admit: 2014-01-12 | Discharge: 2014-01-12 | Disposition: A | Discharge status: Home / Self Care | Payer: No Typology Code available for payment source | Source: Ambulatory Visit | Attending: Pulmonary Disease | Admitting: Pulmonary Disease

## 2014-01-12 VITALS — BP 138/84 | HR 68 | Temp 97.6°F | Ht 69.0 in | Wt 162.2 lb

## 2014-01-12 DIAGNOSIS — E785 Hyperlipidemia, unspecified: Secondary | ICD-10-CM

## 2014-01-12 DIAGNOSIS — F419 Anxiety disorder, unspecified: Secondary | ICD-10-CM

## 2014-01-12 DIAGNOSIS — D649 Anemia, unspecified: Secondary | ICD-10-CM

## 2014-01-12 DIAGNOSIS — D126 Benign neoplasm of colon, unspecified: Secondary | ICD-10-CM

## 2014-01-12 DIAGNOSIS — I10 Essential (primary) hypertension: Secondary | ICD-10-CM

## 2014-01-12 DIAGNOSIS — N320 Bladder-neck obstruction: Secondary | ICD-10-CM

## 2014-01-12 DIAGNOSIS — F411 Generalized anxiety disorder: Secondary | ICD-10-CM

## 2014-01-12 DIAGNOSIS — Z23 Encounter for immunization: Secondary | ICD-10-CM

## 2014-01-12 DIAGNOSIS — J309 Allergic rhinitis, unspecified: Secondary | ICD-10-CM

## 2014-01-12 DIAGNOSIS — K5730 Diverticulosis of large intestine without perforation or abscess without bleeding: Secondary | ICD-10-CM

## 2014-01-12 LAB — BASIC METABOLIC PANEL
BUN: 23 mg/dL (ref 6–23)
CHLORIDE: 102 meq/L (ref 96–112)
CO2: 28 mEq/L (ref 19–32)
CREATININE: 0.7 mg/dL (ref 0.4–1.5)
Calcium: 9.8 mg/dL (ref 8.4–10.5)
GFR: 114.44 mL/min (ref >60.00)
Glucose, Bld: 117 mg/dL — ABNORMAL HIGH (ref 70–99)
Potassium: 4 mEq/L (ref 3.5–5.1)
Sodium: 138 mEq/L (ref 135–145)

## 2014-01-12 LAB — CBC WITH DIFFERENTIAL/PLATELET
BASOS PCT: 0.7 % (ref 0.0–3.0)
Basophils Absolute: 0 10*3/uL (ref 0.0–0.1)
EOS ABS: 0.4 10*3/uL (ref 0.0–0.7)
Eosinophils Relative: 5.9 % — ABNORMAL HIGH (ref 0.0–5.0)
HEMATOCRIT: 41.5 % (ref 39.0–52.0)
Hemoglobin: 14.3 g/dL (ref 13.0–17.0)
LYMPHS ABS: 2.8 10*3/uL (ref 0.7–4.0)
Lymphocytes Relative: 46.1 % — ABNORMAL HIGH (ref 12.0–46.0)
MCHC: 34.4 g/dL (ref 30.0–36.0)
MCV: 92.3 fl (ref 78.0–100.0)
MONO ABS: 0.5 10*3/uL (ref 0.1–1.0)
Monocytes Relative: 8.6 % (ref 3.0–12.0)
NEUTROS PCT: 38.7 % — AB (ref 43.0–77.0)
Neutro Abs: 2.4 10*3/uL (ref 1.4–7.7)
Platelets: 252 10*3/uL (ref 150.0–400.0)
RBC: 4.5 Mil/uL (ref 4.22–5.81)
RDW: 13.5 % (ref 11.5–14.6)
WBC: 6.1 10*3/uL (ref 4.5–10.5)

## 2014-01-12 LAB — LIPID PANEL
CHOL/HDL RATIO: 3
Cholesterol: 207 mg/dL — ABNORMAL HIGH (ref 0–200)
HDL: 64.6 mg/dL (ref >39.00)
LDL Cholesterol: 132 mg/dL — ABNORMAL HIGH (ref 0–99)
Triglycerides: 51 mg/dL (ref 0.0–149.0)
VLDL: 10.2 mg/dL (ref 0.0–40.0)

## 2014-01-12 LAB — PSA: PSA: 2.54 ng/mL (ref 0.10–4.00)

## 2014-01-12 LAB — IBC PANEL
IRON: 115 ug/dL (ref 42–165)
Saturation Ratios: 25.7 % (ref 20.0–50.0)
Transferrin: 319.1 mg/dL (ref 212.0–360.0)

## 2014-01-12 LAB — HEPATIC FUNCTION PANEL
ALT: 25 U/L (ref 0–53)
AST: 24 U/L (ref 0–37)
Albumin: 4.4 g/dL (ref 3.5–5.2)
Alkaline Phosphatase: 45 U/L (ref 39–117)
BILIRUBIN DIRECT: 0.1 mg/dL (ref 0.0–0.3)
BILIRUBIN TOTAL: 1.1 mg/dL (ref 0.3–1.2)
Total Protein: 7 g/dL (ref 6.0–8.3)

## 2014-01-12 LAB — TSH: TSH: 1.28 u[IU]/mL (ref 0.35–5.50)

## 2014-01-12 MED ORDER — LEVOFLOXACIN 500 MG PO TABS: 500.0000 mg | ORAL_TABLET | Freq: Every day | ORAL | Status: DC

## 2014-01-12 MED ORDER — LOSARTAN POTASSIUM 100 MG PO TABS: 100.0000 mg | ORAL_TABLET | Freq: Every day | ORAL | Status: DC

## 2014-01-12 MED ORDER — AMLODIPINE BESYLATE 10 MG PO TABS
10.0000 mg | ORAL_TABLET | Freq: Every day | ORAL | Status: DC
Start: 2014-01-12 — End: 2014-12-07

## 2014-01-12 NOTE — Progress Notes (Signed)
Subjective:    Patient ID: Travis Bell, male    DOB: 1945-12-27, 68 y.o.   MRN: [id number]  HPI 68 y/o WM here for a follow up visit... He has mult medical problems including:  Hx Sinusitis/ Bronchitis/ Allergies;  Mild Hyperchol;  Divertics/ Colon Polyps;  Hx Lentigo skin lesion...  ~  Feb 11, 2012:  Yearly ROV & Travis Bell is feeling well- good year, no new complaints or concerns;  He has been on Norvasc55m/d & BP at home doing well at home he says, remains active, exercising regularly & no CP/ palpit/ SOB/ etc;  We reviewed prob list, meds, xrays & labs> see below>>  CXR 5/13 showed heart at upper lim normal, tortuous Ao, clear lungs, ?left nipple shadow noted... repeat film w/ nipple marker confirmed.  EKG 5/13 showed NSR, rate64, increased voltage, ?early repol ==> we will recheck his 2DEcho.  LABS 5/13:  FLP- ok x LDL=123;  Chems- ok x BS=107;  CBC- wnl;  TSH=1.78;  PSA=2.73  2DEcho 7/13 => showed mild LVH, normal wall motion & EF=55-60%, Gr1DD, mild MR&AI... We decided to add [id number].  ~  Feb 17, 2013:  Yearly ROV & CPX>  Travis Bell to do well but notes some back discomfort after running; he has maintained an active running schedule & exercise program- we discussed this but at his age I have rec that he take it a little easier than usual or consider SportsMed consult w/ Dr. BeAndreas Blower. Rx for Parafon Forte prn muscle spasm... We reviewed the following medical problems during today's office visit >>    Sinusitis/ Bronchitis>  Doing well overall but requests Levaquin to keep on hand for the occas sinus infection...    HBP, mildLVH, mildMR&AI by 2DEcho> on Norvasc10 & Cozaar100;  BP= 120/72 and he denies HA, CP, palpit, SOB, edema, etc; 2DEcho 7/13 shiowed mildLVH, Gr1DD, mildMR&AI...    Chol> on FishOil; FLP 5/14 shows TChol 198, TG 46, HDL 70, LDL 118    GI>  Hx divertics & prev polyps, last colon 2009 by DrOrr w/ one tub adenoma removed, due for f/u colon in 2014 & requests  DrKaplan.    GU- BPH> labs 5/14 shows PSA=3.91 w/ incr PSA velocity & we discussed recheck in 6 mo...    LBP> pt is very active & still runs, c/o LBP intermittently & we rec consult w/ DrBertFields, try Parafon Forte prn...    Others>  Prob list reviewed- denies headaches, skin lesions, feeling well overall, etc... We reviewed prob list, meds, xrays and labs> see below for updates >>   LABS 5/14:  FLP- at goals x LDL=118;  Chems- wnl;  CBC- ok w/ Hg=12.5 MCV=84;  TSH=1.47;  PSA=3.91... He is asked ret for Iron studies, stool cards now but doesn't want to repeat colonoscopy until he turns [age]. ADDENDUM>> f/u labs 03/05/13 showed Hg=12.5 MCV=85, Fe=48 & sat=9%...Marland KitchenMarland Kitchen rec GI consult w/ DrKaplan now=> he saw DrKaplan 7/14 who noted he had an adenomatous polyp removed in 2000 & neg colonoscopies in 2003 & 2009; stool cards were neg; he rec f/u surveillance colonoscopy in 2019  ~  January 12, 2014:  Yearly [employer] a good yr- running marathons in <5h time and denies orthopedic issues, notes that his muscles are better since he decreased the weight that he was using during his work-outs;  feeling well & w/o new complaints or concerns... We reviewed the following medical problems during today's office visit >>    Sinusitis/  Bronchitis>  Doing well overall but requests Levaquin500 to keep on hand for the occas sinus infection; CXR is clear...    HBP, mildLVH, mildMR&AI by 2DEcho> on Norvasc10 & Cozaar100;  BP= 138/84 and he notes that BPs at home are consistently 10 pts lower & ave 120/80's; denies HA, CP, palpit, SOB, edema, etc; 2DEcho 7/13 shiowed mildLVH, Gr1DD, mildMR&AI...    Chol> on FishOil; FLP 5/14 shows TChol 207, TG 51, HDL 65, LDL 132... Not as good needs better diet, refuses med rx.    GI>  Hx divertics & prev polyps, last colon 2009 by DrOrr is reported by the pt to have been neg- w/ one tub adenoma removed in 2000, he saw DrKaplan 7/13, stool cards neg, & rec for f/u colonoscopy 2019...     GU- BPH> he remains asymptomatic w/o LUTS; Labs 4/15 showed PSA= 2.54    LBP> pt is very active & still runs, he denies joint, muscle, or back issues; uses OTC analgesics if needed...    Others>  Prob list reviewed- denies headaches, skin lesions, feeling well overall, etc... We reviewed prob list, meds, xrays and labs> see below for updates >>   CXR 4/15 showed normal heart size, clear lungs, NAD...  LABS 4/15:  FLP- not at goal w/ LDL=132, he wants diet alone;  Chems- ok x BS=117 & we discussed diet needed;  CBC- wnl w/ Hg=14.3, Fe=115 (26%);  TSH=1.28;  PSA=2.54...          Problem List:    ALLERGIC RHINITIS (ICD-477.9) >> he uses OTC antihist as needed...  Hx of SINUSITIS (ICD-473.9) - Amoxicillin doesn't work for him & he likes Levaquin 547m & keeps this on hand.  Hx of BRONCHITIS (ICD-490) - he likes to keep Rx for Levaquin on hand for bronchitic infections... ~  CXR 3/11 showed clear lungs, NAD..Marland Kitchen ~  CXR 3/12 showed heart at upper lim normal, lungs clear, NAD..Marland Kitchen ~  CXR 5/13 showed heart at upper lim normal, tortuous Ao, clear lungs, ?left nipple shadow noted==> repeat film w/ nipple marker confirmed. ~  CXR 4/15 showed normal heart size, clear lungs, NAD...   HYPERTENSION >> he has borderline HBP readings in past w/ 150/90 as the high & mostly 130s/80s... ~  2012:  We decided to start therapy w/ AMLODIPINE 540m& up-titrated to 1065m by TP; he reports home BPs 130s/80s... ~  5/13:  BP= 152/92 & he reports asymptomatic w/o CP, palpit, dizzy, SOB, edema, etc... ~  2DEcho 7/13 showed mild LVH, normal wall motion & EF=55-60%, Gr1DD, mild MR&AI... ~  5/14: on Norvasc10 & Cozaar100;  BP= 120/72 and he denies HA, CP, palpit, SOB, edema, etc. ~  4/15: on Norvasc10 & Cozaar100;  BP= 138/84 and he notes that BPs at home are consistently 10 pts lower & ave 120/80's; denies HA, CP, palpit, SOB, edema, etc; 2DEcho 7/13 shiowed mildLVH, Gr1DD, mildMR&AI  Hx of ABNORMAL ELECTROCARDIOGRAM  (ICD-794.31) - his baseline EKG's have shown benign early repolarization changes and increased voltage... no change over the last 15y57yr 2DEcho 1999 showed normal heart chambers & valves- trivial MR, EF=60%... as noted he exercises regularly & still runs marathons... Repeat 2DEcho 7/13 w/ mild LVH & Losartan added to his Amlodipine... ~  EKG 3/11 showed NSR, rate 62, increased voltage and ?early repol, no acute changes... ~  EKG 3/12 showed NSR, rate67, increased voltage & ?early repol, NAD... ~  EKG 5/13 showed NSR, rate64, increased voltage, ?early repol, no change from old tracings...Marland KitchenMarland Kitchen  HYPERCHOLESTEROLEMIA, BORDERLINE (ICD-272.4) - on diet alone, normal weight, good athlete... ~  [place] 4/08 showed TChol 173, TG 107, HDL 42, LDL 109 ~  FLP 7/09 showed TChol 193, TG 105, HDL 48, LDL 124 ~  FLP 3/11 showed TChol 201, TG 54, HDL 69, LDL 125 ~  FLP 3/12 showed TChol 204, TG 30, HDL 72, LDL 121 ~  FLP 5/13 showed TChol 197, TG 49, HDL 64, LDL 123 ~  FLP 5/14 showed TChol 198, TG 46, HDL 70, LDL 118 ~  FLP 4/15 on diet alone showed TChol 207, TG 51, HDL 65, LDL 132... Needs better low chol/ low fat diet (doesn't want med rx).  DIVERTICULOSIS OF COLON (ICD-562.10) & COLONIC POLYPS (ICD-211.3) - he is friends w/ DrGOrr and colonoscopy 10/03 by him was neg x for sm hems... several 2-72m polyps removed in 2000 by DrKaplan who also noted a few divertics... path= 3 hyperpl polyps & one tubular adenoma... f[place] we don't have copy, pt reports that it was OK... ~  He had GI eval by DDemetra Shiner7/13 for borderline anemia & low iron- his note is reviewed...  BPH >> exam shows enlarged smooth gland & pt denies LTOS etc... ~  PSAs had shown a slow steady rising pattern w/o increased velocity... ~  Labs 3/12 showed PSA= 2.93 ~  Labs 5/13 showed PSA= 2.73... He notes some ED symptoms & we wrote for CIALIS 215mprn. ~  Labs 5/14 showed  PSA= 3.91... Increased PSA velocity & rec to repeat lab in 57m42moe did not return)... ~  Labs 4/15 showed PSA= 2.54  Hx of HEADACHE (ICD-784.0)  Hx of ANXIETY DEPRESSION (ICD-300.4) - prev treated w/ Paxil yrs ago... no recurrent problems...  Hx of LENTIGO (ICD-709.09) - Derm f/u DrTafeen...  Health Maintenance: ~  GI:  colon per DrOrr 8/09 reported by the pt to be "OK"... f/u due 10y66yrr DrKaplan. ~  GU:  CPX 4/15 showed DRE= sl enlarged diffuse gland, & PSA is steady ay 2.54 ~  Immunization:  he has started getting the seasonal flu vaccines;  had PNEUMOVAX in 2000 at age 70, 41given PREVNAR-13 4/15 at age 71; 34ad Tetanus shot 2000 & we gave TDAP 3/11;  Shingles Vaccine Rx written 5/13.   Past Medical History  Diagnosis Date  . Allergic rhinitis   . Sinusitis   . Bronchitis   . Nonspecific abnormal electrocardiogram (ECG) (EKG)   . Diverticulosis of colon (without mention of hemorrhage)   . Benign neoplasm of colon   . Headache(784.0)   . Dysthymic disorder   . Lentigo   . Colon polyps   . HTN (hypertension)     No past surgical history on file.   Outpatient Encounter Prescriptions as of 01/12/2014  Medication Sig  . amLODipine (NORVASC) 10 MG tablet Take 1 tablet (10 mg total) by mouth daily.  . Calcium Carbonate-Vitamin D (CALCIUM + D PO) Take by mouth daily.  . chlorzoxazone (PARAFON) 500 MG tablet Take 1 tablet (500 mg total) by mouth 3 (three) times daily as needed for muscle spasms.  . ferrous sulfate (FEOSOL) 325 (65 FE) MG tablet Take 325 mg by mouth daily with breakfast.  . fish oil-omega-3 fatty acids 1000 MG capsule Take 2 g by mouth daily.  . loMarland Kitchenartan (COZAAR) 100 MG tablet Take 1 tablet (100 mg total) by mouth daily.  . Multiple Vitamins-Minerals (  MULTIVITAMIN PO) Take by mouth daily.  . Nutritional Supplements (JOINT FORMULA PO) Take by mouth daily.  . tadalafil (CIALIS) 20 MG tablet Take 1 tablet (20 mg total) by mouth daily as needed for erectile  dysfunction.    No Known Allergies   Current Medications, Allergies, Past Medical History, Past Surgical History, Family History, and Social History were reviewed in [employer] record.   Review of Systems    The patient denies fever, chills, sweats, anorexia, fatigue, weakness, malaise, weight loss, sleep disorder, blurring, diplopia, eye irritation, eye discharge, vision loss, eye pain, photophobia, earache, ear discharge, tinnitus, decreased hearing, nasal congestion, nosebleeds, sore throat, hoarseness, chest pain, palpitations, syncope, dyspnea on exertion, orthopnea, PND, peripheral edema, cough, dyspnea at rest, excessive sputum, hemoptysis, wheezing, pleurisy, nausea, vomiting, diarrhea, constipation, change in bowel habits, abdominal pain, melena, hematochezia, jaundice, gas/bloating, indigestion/heartburn, dysphagia, odynophagia, dysuria, hematuria, urinary frequency, urinary hesitancy, nocturia, incontinence, back pain, joint pain, joint swelling, muscle cramps, muscle weakness, stiffness, arthritis, sciatica, restless legs, leg pain at night, leg pain with exertion, rash, itching, dryness, suspicious lesions, paralysis, paresthesias, seizures, tremors, vertigo, transient blindness, frequent falls, frequent headaches, difficulty walking, depression, anxiety, memory loss, confusion, cold intolerance, heat intolerance, polydipsia, polyphagia, polyuria, unusual weight change, abnormal bruising, bleeding, enlarged lymph nodes, urticaria, allergic rash, hay fever, and recurrent infections.     Objective:   Physical Exam     WD, WN, 68 y/o WM in NAD... GENERAL:  Alert & oriented; pleasant & cooperative... HEENT:  [place]/AT, EOM-wnl, PERRLA, Fundi-benign, EACs-clear, TMs-wnl, NOSE-clear, THROAT-clear & wnl. NECK:  Supple w/ full ROM; no JVD; normal carotid impulses w/o bruits; no thyromegaly or nodules palpated; no lymphadenopathy. CHEST:  Clear to P & A; without wheezes/  rales/ or rhonchi heard... HEART:  Regular Rhythm; without murmurs/ rubs/ or gallops detected... ABDOMEN:  Soft & nontender; normal bowel sounds; no organomegaly or masses palpated... EXT: without deformities or arthritic changes; no varicose veins/ venous insuffic/ or edema... NEURO:  CN's intact; motor testing normal; sensory testing normal; gait normal & balance OK. DERM:  No lesions noted; no rash etc...  RADIOLOGY DATA:  Reviewed in the EPIC EMR & discussed w/ the patient...  LABORATORY DATA:  Reviewed in the EPIC EMR & discussed w/ the patient...   Assessment & Plan:    AR/Hx Sinusitis>  He likes to keep Levaquin 533m Rx on hand for sinus infections etc...  HBP>  BP stable on Amlod & Losartan- continue same...  ABN EKG>  He has increased voltage, ?early repol, no changes; & 2DEcho 7/13 w/ mild LVH...  CHOL>  His LDL has been in the 120-130 range but other parameters are wnl; continue diet Rx, he does not want meds...  GI> Divertics, Polyps>  He is a personal friend of DrOrrs; last colon 8/09; he saw DrKaplan 7/14 & his note is reviewed...  GU> BPH, mild ED>  He denies LTOS but prostate is big on DRE; PSA 4/15 is 2.54...  Anxiety>  He is stable w/o need for meds...  Hx Lentigo>  Followed by DTami Lin..   Patient's Medications  New Prescriptions   LEVOFLOXACIN (LEVAQUIN) 500 MG TABLET    Take 1 tablet (500 mg total) by mouth daily.  Previous Medications   CALCIUM CARBONATE-VITAMIN D (CALCIUM + D PO)    Take by mouth daily.   CHLORZOXAZONE (PARAFON) 500 MG TABLET    Take 1 tablet (500 mg total) by mouth 3 (three) times daily as needed for muscle spasms.  FERROUS SULFATE (FEOSOL) 325 (65 FE) MG TABLET    Take 325 mg by mouth daily with breakfast.   FISH OIL-OMEGA-3 FATTY ACIDS 1000 MG CAPSULE    Take 2 g by mouth daily.   MULTIPLE VITAMINS-MINERALS (MULTIVITAMIN PO)    Take by mouth daily.   NUTRITIONAL SUPPLEMENTS (JOINT FORMULA PO)    Take by mouth daily.    TADALAFIL (CIALIS) 20 MG TABLET    Take 1 tablet (20 mg total) by mouth daily as needed for erectile dysfunction.  Modified Medications   Modified Medication Previous Medication   AMLODIPINE (NORVASC) 10 MG TABLET amLODipine (NORVASC) 10 MG tablet      Take 1 tablet (10 mg total) by mouth daily.    Take 1 tablet (10 mg total) by mouth daily.   LOSARTAN (COZAAR) 100 MG TABLET losartan (COZAAR) 100 MG tablet      Take 1 tablet (100 mg total) by mouth daily.    Take 1 tablet (100 mg total) by mouth daily.  Discontinued Medications   No medications on file

## 2014-01-12 NOTE — Telephone Encounter (Signed)
Per SN---  Please call the pt back and have him come by the office to get the prevnar 13 and the stool cards.  Called and spoke with the pt and he will stop by the office tomorrow.

## 2014-01-12 NOTE — Telephone Encounter (Signed)
Don't see in pt chart where he was suppose to get these. Please advise SN thanks

## 2014-01-12 NOTE — Patient Instructions (Signed)
Today we updated your med list in our EPIC system...    Continue your current medications the same...    We refilled your meds per request...  Today we did your follow up CXR & FASTING blood work...    We will contact you w/ the results when available...   Call for any questions or if we can be of service in any way.Marland KitchenMarland Kitchen

## 2014-01-13 ENCOUNTER — Ambulatory Visit: Payer: BC Managed Care – PPO | Admitting: Pulmonary Disease

## 2014-01-31 ENCOUNTER — Other Ambulatory Visit: Payer: Self-pay | Admitting: Pulmonary Disease

## 2014-01-31 ENCOUNTER — Other Ambulatory Visit: Payer: No Typology Code available for payment source

## 2014-01-31 DIAGNOSIS — D649 Anemia, unspecified: Secondary | ICD-10-CM

## 2014-02-01 LAB — HEMOCCULT SLIDES (X 3 CARDS)
Fecal Occult Blood: NEGATIVE
OCCULT 1: NEGATIVE
OCCULT 2: NEGATIVE
OCCULT 3: NEGATIVE
OCCULT 4: NEGATIVE
OCCULT 5: NEGATIVE

## 2014-02-10 ENCOUNTER — Telehealth: Payer: Self-pay | Admitting: Pulmonary Disease

## 2014-02-11 NOTE — Progress Notes (Signed)
Quick Note:  Called and spoke to pt regarding results. Pt verbalized understanding and denied any further questions or concerns at this time. ______ 

## 2014-02-11 NOTE — Telephone Encounter (Signed)
Called and spoke to pt regarding stool card results per SN. Pt verbalized understanding and denied any further questions or concerns at this time.

## 2014-12-07 ENCOUNTER — Telehealth: Payer: Self-pay | Admitting: Pulmonary Disease

## 2014-12-07 MED ORDER — AMLODIPINE BESYLATE 10 MG PO TABS: 10.0000 mg | ORAL_TABLET | Freq: Every day | ORAL | Status: DC

## 2014-12-07 MED ORDER — LOSARTAN POTASSIUM 100 MG PO TABS: 100.0000 mg | ORAL_TABLET | Freq: Every day | ORAL | Status: DC

## 2014-12-07 NOTE — Telephone Encounter (Signed)
Pt returned call  [phone]

## 2014-12-07 NOTE — Telephone Encounter (Signed)
Cell number [phone]; Patient says that Dr. Lenna Gilford would give him a year supply of Amlodipine and Losartan.  His insurance company refused to pay for the last 90 day supply and he needs a handwritten RX for Amlodipine and Losartan (90 days) so he can take it to a local pharmacy to have filled.  He says that he was released by Dr. Lenna Gilford and he has an appointment scheduled with the provider that Dr. Lenna Gilford referred him too, but he needs to get his medication until he can see the new PCP.    To Leigh

## 2014-12-07 NOTE — Telephone Encounter (Signed)
lmtcb x1 

## 2014-12-07 NOTE — Telephone Encounter (Signed)
lmomtcb x 1  For the pt to find out what meds he will need refilled.

## 2014-12-07 NOTE — Telephone Encounter (Signed)
Tried to help pt with his refills. Only wants to speak to [facility] about these.

## 2014-12-07 NOTE — Telephone Encounter (Signed)
Called and spoke with pt and he is aware of rx left up front and ready to be picked up .  Nothing further is needed.

## 2015-03-28 ENCOUNTER — Ambulatory Visit (INDEPENDENT_AMBULATORY_CARE_PROVIDER_SITE_OTHER): Payer: Medicare Other | Admitting: Internal Medicine

## 2015-03-28 ENCOUNTER — Encounter: Payer: Self-pay | Admitting: Internal Medicine

## 2015-03-28 VITALS — BP 120/70 | HR 77 | Temp 98.0°F | Resp 16 | Wt 157.0 lb

## 2015-03-28 DIAGNOSIS — I10 Essential (primary) hypertension: Secondary | ICD-10-CM

## 2015-03-28 DIAGNOSIS — H6121 Impacted cerumen, right ear: Secondary | ICD-10-CM

## 2015-03-28 NOTE — Patient Instructions (Signed)
Please do not use Q-tips as this simply packs the wax down against he eardrum. Should wax build up occur, please put 2-3 drops of mineral oil in the ear at night and cover the canal with a  cotton ball.In the morning fill the canal with hydrogen peroxide & leave  for 10-15 minutes.Following this shower and use the thinnest washrag available to wick out the wax.   Minimal Blood Pressure Goal= AVERAGE < 140/90;  Ideal is an AVERAGE < 135/85. This AVERAGE should be calculated from @ least 5-7 BP readings taken @ different times of day on different days of week. You should not respond to isolated BP readings , but rather the AVERAGE for that week .Please bring your  blood pressure cuff to office visits to verify that it is reliable.It  can also be checked against the blood pressure device at the pharmacy. Finger or wrist cuffs are not dependable; an arm cuff is.

## 2015-03-28 NOTE — Progress Notes (Signed)
Pre visit review using our clinic review tool, if applicable. No additional management support is needed unless otherwise documented below in the visit note. 

## 2015-03-28 NOTE — Progress Notes (Signed)
   Subjective:    Patient ID: Travis Bell, male    DOB: 1946-04-30, 69 y.o.   MRN: [id number]  HPI He describes pressure and decreased hearing in the right ear  beginning 03/22/18. He had been using Q-tips without success. He  had the ear gavaged 2-3 years ago.  He denies any associated upper respiratory tract or extrinsic symptoms.No tinnitus or discharge.  Blood pressure at home is 130/85.  He has been compliant with his medications without adverse effects.   Review of Systems Frontal headache, facial pain , nasal purulence, dental pain, sore throat , otic pain or otic discharge denied. No fever , chills or sweats.  Chest pain, palpitations, tachycardia, exertional dyspnea, paroxysmal nocturnal dyspnea, claudication or edema are absent.       Objective:   Physical Exam  General appearance:Adequately nourished; no acute distress or increased work of breathing is present.    Lymphatic: No  lymphadenopathy about the head, neck, or axilla .  Eyes: No conjunctival inflammation or lid edema is present. There is no scleral icterus.  Ears:  External ear exam shows no significant lesions or deformities.  Wax occlusion on R with decreased hearing. L tympanic membrane is  intact bilaterally without bulging, retraction, inflammation or discharge.  Nose:  External nasal examination shows no deformity or inflammation. Nasal mucosa are pink and moist without lesions or exudates No septal dislocation or deviation.No obstruction to airflow.   Oral exam: Dental hygiene is good; lips and gums are healthy appearing.There is no oropharyngeal erythema or exudate .  Neck:  No deformities, thyromegaly, masses, or tenderness noted.   Marland Kitchen   Heart:  Normal rate and regular rhythm. S1 and S2 normal without gallop, murmur, click, or rub S4 with slurring at  LSB present.   Lungs:Chest clear to auscultation; no wheezes, rhonchi,rales ,or rubs present.  Extremities:  No cyanosis, edema, or clubbing   noted    Skin: Warm & dry w/o tenting or jaundice. No significant lesions or rash.         Assessment & Plan:  #1 cerumen impaction, R with decreased hearing  #2 HTN See AVS

## 2015-04-06 ENCOUNTER — Encounter: Payer: Self-pay | Admitting: Family

## 2015-04-06 ENCOUNTER — Ambulatory Visit (INDEPENDENT_AMBULATORY_CARE_PROVIDER_SITE_OTHER): Payer: Medicare Other | Admitting: Family

## 2015-04-06 VITALS — BP 138/98 | HR 74 | Temp 97.2°F | Resp 18 | Ht 69.0 in | Wt 159.8 lb

## 2015-04-06 DIAGNOSIS — Z0000 Encounter for general adult medical examination without abnormal findings: Secondary | ICD-10-CM | POA: Diagnosis not present

## 2015-04-06 DIAGNOSIS — Z0001 Encounter for general adult medical examination with abnormal findings: Secondary | ICD-10-CM | POA: Insufficient documentation

## 2015-04-06 NOTE — Assessment & Plan Note (Addendum)
1) Anticipatory Guidance: Discussed importance of wearing a seatbelt while driving and not texting while driving; changing batteries in smoke detector at least once annually; wearing suntan lotion when outside; eating a balanced and moderate diet; getting physical activity at least 30 minutes per day.  2) Immunizations / Screenings / Labs:  Patient declines Pneumovax. All other immunizations are up-to-date per recommendations. All screenings are up-to-date per recommendations. Obtain CBC, BMET, and Lipid profile.  Overall well exam. Patient has minimal risk factors for cardiovascular disease with the exception of hypertension which is slightly elevated today but mostly controlled with current regimen of amlodipine and losartan. Continue current dosage of amlodipine and losartan. Continue healthy lifestyle behaviors and choices as well as physical activity. Follow-up prevention exam in 1 year; follow-up office visit pending lab work.

## 2015-04-06 NOTE — Assessment & Plan Note (Signed)
Reviewed and updated patient's medical, surgical, family and social history. Medications and allergies were also reviewed. Basic screenings for depression, activities of daily living, hearing, cognition and safety were performed. Provider list was updated and health plan was provided to the patient.  

## 2015-04-06 NOTE — Progress Notes (Signed)
Pre visit review using our clinic review tool, if applicable. No additional management support is needed unless otherwise documented below in the visit note. 

## 2015-04-06 NOTE — Progress Notes (Signed)
Subjective:    Patient ID: Travis Bell, male    DOB: 24-Jul-1946, 69 y.o.   MRN: [id number]  Chief Complaint  Patient presents with  . Establish Care    CPE     HPI:  Travis Bell is a 69 y.o. male who presents today for an annual wellness visit.   1) Health Maintenance -   Diet - Averages 4-5 small meals per day consisting of fruits, vegetables, fish, chicken, red meat; 3-4 cups of caffeine per day  Exercise - Runs frequenty  2) Preventative Exams / Immunizations:  Dental -- Up to date  Vision -- Up to date   Health Maintenance  Topic Date Due  . PNA vac Low Risk Adult (2 of 2 - PPSV23) 04/01/2016 (Originally 01/14/2015)  . INFLUENZA VACCINE  05/01/2015  . COLONOSCOPY  02/28/2018  . TETANUS/TDAP  12/09/2019  . ZOSTAVAX  Addressed     Immunization History  Administered Date(s) Administered  . Influenza Split 07/31/2012  . Influenza-Unspecified 07/31/2013, 08/30/2014  . Pneumococcal Conjugate-13 01/13/2014  . Td 12/08/2009     RISK FACTORS  Tobacco History  Smoking status  . Former Smoker -- 0.50 packs/day for 15 years  . Types: Cigarettes  . Quit date: 09/30/1977  Smokeless tobacco  . Never Used     Cardiac risk factors: advanced age (older than 21 for men, 65 for women), hypertension and male gender.  Depression Screen  Q1: Over the past two weeks, have you felt down, depressed or hopeless? No  Q2: Over the past two weeks, have you felt little interest or pleasure in doing things? No  Have you lost interest or pleasure in daily life? No  Do you often feel hopeless? No  Do you cry easily over simple problems? No  Activities of Daily Living In your present state of health, do you have any difficulty performing the following activities?:  Driving? No Managing money?  No Feeding yourself? No Getting from bed to chair? No Climbing a flight of stairs? No Preparing food and eating?: No Bathing or showering? No Getting dressed: No Getting to  the toilet? No Using the toilet: No Moving around from place to place: No In the past year have you fallen or had a near fall?:No   Home Safety Has smoke detector and wears seat belts. No firearms. No excess sun exposure. Are there smokers in your home (other than you)?  No Do you feel safe at home?  Yes  Hearing Difficulties: No Do you often ask people to speak up or repeat themselves? No Do you experience ringing or noises in your ears? No  Do you have difficulty understanding soft or whispered voices? No    Cognitive Testing  Alert? Yes   Normal Appearance? Yes   Oriented to person? Yes  Place? Yes   Time? Yes  Recall of three objects?  Yes  Can perform simple calculations? Yes  Displays appropriate judgment? Yes  Can read the correct time from a watch face? Yes  Do you feel that you have a problem with memory? No  Do you often misplace items? No   Advanced Directives have been discussed with the patient? Does not currently have.   Current Physicians/Providers and Suppliers  1. Dr. Teressa Lower, MD - Internal medicine 2. Terri Piedra, FNP - PCP / Internal medicine.   Indicate any recent Medical Services you may have received from other than [facility] providers in the past year (date may be approximate).  All answers were reviewed with the patient and necessary referrals were made:  Mauricio Po, [place]   04/06/2015    Review of Systems  Constitutional: Denies fever, chills, fatigue, or significant weight gain/loss. HENT: Head: Denies headache or neck pain Ears: Denies changes in hearing, ringing in ears, earache, drainage Nose: Denies discharge, stuffiness, itching, nosebleed, sinus pain Throat: Denies sore throat, hoarseness, dry mouth, sores, thrush Eyes: Denies loss/changes in vision, pain, redness, blurry/double vision, flashing lights Cardiovascular: Denies chest pain/discomfort, tightness, palpitations, shortness of breath with activity, difficulty lying down,  swelling, sudden awakening with shortness of breath Respiratory: Denies shortness of breath, cough, sputum production, wheezing Gastrointestinal: Denies dysphasia, heartburn, change in appetite, nausea, change in bowel habits, rectal bleeding, constipation, diarrhea, yellow skin or eyes Genitourinary: Denies frequency, urgency, burning/pain, blood in urine, incontinence, change in urinary strength. Musculoskeletal: Denies muscle/joint pain, stiffness, back pain, redness or swelling of joints, trauma Skin: Denies rashes, lumps, itching, dryness, color changes, or hair/nail changes Neurological: Denies dizziness, fainting, seizures, weakness, numbness, tingling, tremor Psychiatric - Denies nervousness, stress, depression or memory loss Endocrine: Denies heat or cold intolerance, sweating, frequent urination, excessive thirst, changes in appetite Hematologic: Denies ease of bruising or bleeding    Objective:    BP 138/98 mmHg  Pulse 74  Temp(Src) 97.2 F (36.2 C) (Oral)  Resp 18  Ht 5\' 9"  (1.753 m)  Wt 159 lb 12.8 oz (72.485 kg)  BMI 23.59 kg/m2  SpO2 97% Nursing note and vital signs reviewed.  Physical Exam  Constitutional: He is oriented to person, place, and time. He appears well-developed and well-nourished.  HENT:  Head: Normocephalic.  Right Ear: Hearing, tympanic membrane, external ear and ear canal normal.  Left Ear: Hearing, tympanic membrane, external ear and ear canal normal.  Nose: Nose normal.  Mouth/Throat: Uvula is midline, oropharynx is clear and moist and mucous membranes are normal.  Eyes: Conjunctivae and EOM are normal. Pupils are equal, round, and reactive to light.  Neck: Neck supple. No JVD present. No tracheal deviation present. No thyromegaly present.  Cardiovascular: Normal rate, regular rhythm, normal heart sounds and intact distal pulses.   Pulmonary/Chest: Effort normal and breath sounds normal.  Abdominal: Soft. Bowel sounds are normal. He exhibits no  distension and no mass. There is no tenderness. There is no rebound and no guarding.  Musculoskeletal: Normal range of motion. He exhibits no edema or tenderness.  Lymphadenopathy:    He has no cervical adenopathy.  Neurological: He is alert and oriented to person, place, and time. He has normal reflexes. No cranial nerve deficit. He exhibits normal muscle tone. Coordination normal.  Skin: Skin is warm and dry.  Psychiatric: He has a normal mood and affect. His behavior is normal. Judgment and thought content normal.       Assessment & Plan:   During the course of the visit the patient was educated and counseled about appropriate screening and preventive services including:    Pneumococcal vaccine   Td vaccine  Prostate cancer screening  Colorectal cancer screening  Nutrition counseling   Advanced directives: has NO advanced directive - not interested in additional information  Diet review for nutrition referral? Yes ____  Not Indicated _X___   Patient Instructions (the written plan) was given to the patient.  Medicare Attestation I have personally reviewed: The patient's medical and social history Their use of alcohol, tobacco or illicit drugs Their current medications and supplements The patient's functional ability including ADLs,fall risks, home safety risks, cognitive, and  hearing and visual impairment Diet and physical activities Evidence for depression or mood disorders  The patient's weight, height, BMI,  have been recorded in the chart.  I have made referrals, counseling, and provided education to the patient based on review of the above and I have provided the patient with a written personalized care plan for preventive services.     Mauricio Po, [place]   04/06/2015

## 2015-04-06 NOTE — Patient Instructions (Addendum)
Thank you for choosing [facility].  Summary/Instructions:   Health Maintenance A healthy lifestyle and preventative care can promote health and wellness.  Maintain regular health, dental, and eye exams.  Eat a healthy diet. Foods like vegetables, fruits, whole grains, low-fat dairy products, and lean protein foods contain the nutrients you need and are low in calories. Decrease your intake of foods high in solid fats, added sugars, and salt. Get information about a proper diet from your health care provider, if necessary.  Regular physical exercise is one of the most important things you can do for your health. Most adults should get at least 150 minutes of moderate-intensity exercise (any activity that increases your heart rate and causes you to sweat) each week. In addition, most adults need muscle-strengthening exercises on 2 or more days a week.   Maintain a healthy weight. The body mass index (BMI) is a screening tool to identify possible weight problems. It provides an estimate of body fat based on height and weight. Your health care provider can find your BMI and can help you achieve or maintain a healthy weight. For males 20 years and older:  A BMI below 18.5 is considered underweight.  A BMI of 18.5 to 24.9 is normal.  A BMI of 25 to 29.9 is considered overweight.  A BMI of 30 and above is considered obese.  Maintain normal blood lipids and cholesterol by exercising and minimizing your intake of saturated fat. Eat a balanced diet with plenty of fruits and vegetables. Blood tests for lipids and cholesterol should begin at age 7 and be repeated every 5 years. If your lipid or cholesterol levels are high, you are over age 27, or you are at high risk for heart disease, you may need your cholesterol levels checked more frequently.Ongoing high lipid and cholesterol levels should be treated with medicines if diet and exercise are not working.  If you smoke, find out from your  health care provider how to quit. If you do not use tobacco, do not start.  Lung cancer screening is recommended for adults aged 40-80 years who are at high risk for developing lung cancer because of a history of smoking. A yearly low-dose CT scan of the lungs is recommended for people who have at least a 30-pack-year history of smoking and are current smokers or have quit within the past 15 years. A pack year of smoking is smoking an average of 1 pack of cigarettes a day for 1 year (for example, a 30-pack-year history of smoking could mean smoking 1 pack a day for 30 years or 2 packs a day for 15 years). Yearly screening should continue until the smoker has stopped smoking for at least 15 years. Yearly screening should be stopped for people who develop a health problem that would prevent them from having lung cancer treatment.  If you choose to drink alcohol, do not have more than 2 drinks per day. One drink is considered to be 12 oz (360 mL) of beer, 5 oz (150 mL) of wine, or 1.5 oz (45 mL) of liquor.  Avoid the use of street drugs. Do not share needles with anyone. Ask for help if you need support or instructions about stopping the use of drugs.  High blood pressure causes heart disease and increases the risk of stroke. Blood pressure should be checked at least every 1-2 years. Ongoing high blood pressure should be treated with medicines if weight loss and exercise are not effective.  If you  are 75-66 years old, ask your health care provider if you should take aspirin to prevent heart disease.  Diabetes screening involves taking a blood sample to check your fasting blood sugar level. This should be done once every 3 years after age 51 if you are at a normal weight and without risk factors for diabetes. Testing should be considered at a younger age or be carried out more frequently if you are overweight and have at least 1 risk factor for diabetes.  Colorectal cancer can be detected and often  prevented. Most routine colorectal cancer screening begins at the age of 23 and continues through age 29. However, your health care provider may recommend screening at an earlier age if you have risk factors for colon cancer. On a yearly basis, your health care provider may provide home test kits to check for hidden blood in the stool. A small camera at the end of a tube may be used to directly examine the colon (sigmoidoscopy or colonoscopy) to detect the earliest forms of colorectal cancer. Talk to your health care provider about this at age 46 when routine screening begins. A direct exam of the colon should be repeated every 5-10 years through age 63, unless early forms of precancerous polyps or small growths are found.  People who are at an increased risk for hepatitis B should be screened for this virus. You are considered at high risk for hepatitis B if:  You were born in a country where hepatitis B occurs often. Talk with your health care provider about which countries are considered high risk.  Your parents were born in a high-risk country and you have not received a shot to protect against hepatitis B (hepatitis B vaccine).  You have HIV or AIDS.  You use needles to inject street drugs.  You live with, or have sex with, someone who has hepatitis B.  You are a man who has sex with other men (MSM).  You get hemodialysis treatment.  You take certain medicines for conditions like cancer, organ transplantation, and autoimmune conditions.  Hepatitis C blood testing is recommended for all people born from [birth year] through 1963 and any individual with known risk factors for hepatitis C.  Healthy men should no longer receive prostate-specific antigen (PSA) blood tests as part of routine cancer screening. Talk to your health care provider about prostate cancer screening.  Testicular cancer screening is not recommended for adolescents or adult males who have no symptoms. Screening includes  self-exam, a health care provider exam, and other screening tests. Consult with your health care provider about any symptoms you have or any concerns you have about testicular cancer.  Practice safe sex. Use condoms and avoid high-risk sexual practices to reduce the spread of sexually transmitted infections (STIs).  You should be screened for STIs, including gonorrhea and chlamydia if:  You are sexually active and are younger than 24 years.  You are older than 24 years, and your health care provider tells you that you are at risk for this type of infection.  Your sexual activity has changed since you were last screened, and you are at an increased risk for chlamydia or gonorrhea. Ask your health care provider if you are at risk.  If you are at risk of being infected with HIV, it is recommended that you take a prescription medicine daily to prevent HIV infection. This is called pre-exposure prophylaxis (PrEP). You are considered at risk if:  You are a man who has sex with  other men (MSM).  You are a heterosexual man who is sexually active with multiple partners.  You take drugs by injection.  You are sexually active with a partner who has HIV.  Talk with your health care provider about whether you are at high risk of being infected with HIV. If you choose to begin PrEP, you should first be tested for HIV. You should then be tested every 3 months for as long as you are taking PrEP.  Use sunscreen. Apply sunscreen liberally and repeatedly throughout the day. You should seek shade when your shadow is shorter than you. Protect yourself by wearing long sleeves, pants, a wide-brimmed hat, and sunglasses year round whenever you are outdoors.  Tell your health care provider of new moles or changes in moles, especially if there is a change in shape or color. Also, tell your health care provider if a mole is larger than the size of a pencil eraser.  A one-time screening for abdominal aortic aneurysm  (AAA) and surgical repair of large AAAs by ultrasound is recommended for men aged 29-75 years who are current or former smokers.  Stay current with your vaccines (immunizations). Document Released: 03/14/2008 Document Revised: 09/21/2013 Document Reviewed: 02/11/2011 [facility] Patient Information 2015 [place], [place]. This information is not intended to replace advice given to you by your health care provider. Make sure you discuss any questions you have with your health care provider.   Health Maintenance  Topic Date Due  . PNA vac Low Risk Adult (2 of 2 - PPSV23) 04/01/2016 (Originally 01/14/2015)  . INFLUENZA VACCINE  05/01/2015  . COLONOSCOPY  02/28/2018  . TETANUS/TDAP  12/09/2019  . ZOSTAVAX  Addressed    Advance Directive Advance directives are the legal documents that allow you to make choices about your health care and medical treatment if you cannot speak for yourself. Advance directives are a way for you to communicate your wishes to family, friends, and health care providers. The specified people can then convey your decisions about end-of-life care to avoid confusion if you should become unable to communicate. Ideally, the process of discussing and writing advance directives should happen over time rather than making decisions all at once. Advance directives can be modified as your situation changes, and you can change your mind at any time, even after you have signed the advance directives. Each state has its own laws regarding advance directives. You may want to check with your health care provider, attorney, or state representative about the law in your state. Below are some examples of advance directives. LIVING WILL A living will is a set of instructions documenting your wishes about medical care when you cannot care for yourself. It is used if you become:  Terminally ill.  Incapacitated.  Unable to communicate.  Unable to make decisions. Items to consider in your living  will include:  The use or non-use of life-sustaining equipment, such as dialysis machines and breathing machines (ventilators).  A do not resuscitate (DNR) order, which is the instruction not to use cardiopulmonary resuscitation (CPR) if breathing or heartbeat stops.  Tube feeding.  Withholding of food and fluids.  Comfort (palliative) care when the goal becomes comfort rather than a cure.  Organ and tissue donation. A living will does not give instructions about distribution of your money and property if you should pass away. It is advisable to seek the expert advice of a lawyer in drawing up a will regarding your possessions. Decisions about taxes, beneficiaries, and asset distribution  will be legally binding. This process can relieve your family and friends of any burdens surrounding disputes or questions that may come up about the allocation of your assets. DO NOT RESUSCITATE (DNR) A do not resuscitate (DNR) order is a request to not have CPR in the event that your heart stops beating or you stop breathing. Unless given other instructions, a health care provider will try to help any patient whose heart has stopped or who has stopped breathing.  HEALTH CARE PROXY AND DURABLE POWER OF ATTORNEY FOR HEALTH CARE A health care proxy is a person (agent) appointed to make medical decisions for you if you cannot. Generally, people choose someone they know well and trust to represent their preferences when they can no longer do so. You should be sure to ask this person for agreement to act as your agent. An agent may have to exercise judgment in the event of a medical decision for which your wishes are not known. The durable power of attorney for health care is the legal document that names your health care proxy. Once written, it should be:  Signed.  Notarized.  Dated.  Copied.  Witnessed.  Incorporated into your medical record. You may also want to appoint someone to manage your financial  affairs if you cannot. This is called a durable power of attorney for finances. It is a separate legal document from the durable power of attorney for health care. You may choose the same person or someone different from your health care proxy to act as your agent in financial matters. Document Released: 12/24/2007 Document Revised: 09/21/2013 Document Reviewed: 02/03/2013 [facility] Patient Information 2015 [place], [place]. This information is not intended to replace advice given to you by your health care provider. Make sure you discuss any questions you have with your health care provider.

## 2015-04-12 ENCOUNTER — Other Ambulatory Visit (INDEPENDENT_AMBULATORY_CARE_PROVIDER_SITE_OTHER): Payer: Medicare Other

## 2015-04-12 ENCOUNTER — Encounter: Payer: Self-pay | Admitting: Family

## 2015-04-12 DIAGNOSIS — Z0000 Encounter for general adult medical examination without abnormal findings: Secondary | ICD-10-CM

## 2015-04-12 LAB — LIPID PANEL
Cholesterol: 191 mg/dL (ref 0–200)
HDL: 70.4 mg/dL (ref >39.00)
LDL Cholesterol: 111 mg/dL — ABNORMAL HIGH (ref 0–99)
NonHDL: 120.6
Total CHOL/HDL Ratio: 3
Triglycerides: 49 mg/dL (ref 0.0–149.0)
VLDL: 9.8 mg/dL (ref 0.0–40.0)

## 2015-04-12 LAB — CBC
HEMATOCRIT: 42.7 % (ref 39.0–52.0)
Hemoglobin: 14.6 g/dL (ref 13.0–17.0)
MCHC: 34.2 g/dL (ref 30.0–36.0)
MCV: 91.6 fl (ref 78.0–100.0)
Platelets: 242 10*3/uL (ref 150.0–400.0)
RBC: 4.66 Mil/uL (ref 4.22–5.81)
RDW: 13.6 % (ref 11.5–15.5)
WBC: 6.1 10*3/uL (ref 4.0–10.5)

## 2015-04-12 LAB — BASIC METABOLIC PANEL
BUN: 20 mg/dL (ref 6–23)
CHLORIDE: 103 meq/L (ref 96–112)
CO2: 25 mEq/L (ref 19–32)
CREATININE: 0.83 mg/dL (ref 0.40–1.50)
Calcium: 9.4 mg/dL (ref 8.4–10.5)
GFR: 98.31 mL/min (ref >60.00)
Glucose, Bld: 115 mg/dL — ABNORMAL HIGH (ref 70–99)
Potassium: 4.2 mEq/L (ref 3.5–5.1)
Sodium: 137 mEq/L (ref 135–145)

## 2015-06-22 ENCOUNTER — Encounter: Payer: Self-pay | Admitting: Family Medicine

## 2015-06-22 ENCOUNTER — Ambulatory Visit (INDEPENDENT_AMBULATORY_CARE_PROVIDER_SITE_OTHER): Payer: Medicare Other | Admitting: Family Medicine

## 2015-06-22 VITALS — BP 138/84 | HR 69 | Ht 69.0 in | Wt 157.0 lb

## 2015-06-22 DIAGNOSIS — M24559 Contracture, unspecified hip: Secondary | ICD-10-CM | POA: Insufficient documentation

## 2015-06-22 DIAGNOSIS — M24551 Contracture, right hip: Secondary | ICD-10-CM

## 2015-06-22 MED ORDER — PREDNISONE 50 MG PO TABS: 50.0000 mg | ORAL_TABLET | Freq: Every day | ORAL | Status: DC

## 2015-06-22 NOTE — Progress Notes (Signed)
[facility] [name], [name] Phone: [phone] Subjective:    I'm seeing this patient by the request  of:  Travis Po, FNP   CC: Runner with back pain  [name] is a 69 y.o. male coming in with complaint of back pain. 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 Patient states that running and greater than 14 miles he starts having pain in the right back. Patient states that sometimes seems to affect his breathing. Not on short runs. Has tried many different muscle relaxants and pain medicine with no improvement.  Does not affect daily activities Rates severity of 7/10 when it occurs.  Denies chest pain or radiation.  Has always finished the race.  No weakness.   Past Medical History  Diagnosis Date  . Allergic rhinitis   . Sinusitis   . Bronchitis   . Nonspecific abnormal electrocardiogram (ECG) (EKG)   . Diverticulosis of colon (without mention of hemorrhage)   . Benign neoplasm of colon   . Headache(784.0)   . Dysthymic disorder   . Lentigo   . Colon polyps   . HTN (hypertension)    No past surgical history on file. Social History  Substance Use Topics  . Smoking status: Former Smoker -- 0.50 packs/day for 15 years    Types: Cigarettes    Quit date: 09/30/1977  . Smokeless tobacco: Never Used  . Alcohol Use: 0.0 oz/week     Comment: 2 drinks per day   No Known Allergies Family History  Problem Relation Age of Onset  . Heart disease Father   . Healthy Sister   . Stroke Mother        Past medical history, social, surgical and family history all reviewed in electronic medical record.   Review of Systems: No headache, visual changes, nausea, vomiting, diarrhea, constipation, dizziness, abdominal pain, skin rash, fevers, chills, night sweats, weight loss, swollen lymph nodes, body aches, joint swelling, muscle aches, chest pain, shortness of breath, mood  changes.   Objective Blood pressure 138/84, pulse 69, height 5\' 9"  (1.753 m), weight 157 lb (71.215 kg), SpO2 99 %.  General: No apparent distress alert and oriented x3 mood and affect normal, dressed appropriately.  HEENT: Pupils equal, extraocular movements intact  Respiratory: Patient's speak in full sentences and does not appear short of breath  Cardiovascular: No lower extremity edema, non tender, no erythema  Skin: Warm dry intact with no signs of infection or rash on extremities or on axial skeleton.  Abdomen: Soft nontender  Neuro: Cranial nerves II through XII are intact, neurovascularly intact in all extremities with 2+ DTRs and 2+ pulses.  Lymph: No lymphadenopathy of posterior or anterior cervical chain or axillae bilaterally.  Gait normal with good balance and coordination.  MSK:  Non tender with full range of motion and good stability and symmetric strength and tone of shoulders, elbows, wrist, hip, knee and ankles bilaterally.  Back Exam:  Inspection: Unremarkable  Motion: Flexion 35 deg, Extension 25 deg, Side Bending to 35 deg bilaterally,  Rotation to 35 deg bilaterally  SLR laying: Negative  XSLR laying: Negative  Palpable tenderness: patient does have some mild tenderness of the paraspinal musculature. Patient also has significant tightness of the hip flexors bilaterally right greater than left FABER: negative. Sensory change: Gross sensation intact to all lumbar and sacral dermatomes.  Reflexes: 2+ at both patellar tendons, 2+ at  achilles tendons, Babinski's downgoing.  Strength at foot  Plantar-flexion: 5/5 Dorsi-flexion: 5/5 Eversion: 5/5 Inversion: 5/5  Leg strength  Quad: 5/5 Hamstring: 5/5 Hip flexor: 5/5 Hip abductors: 4/5 but symmetric Gait unremarkable.  [id number]; 15 minutes spent for Therapeutic exercises as stated in above notes.  This included exercises focusing on stretching, strengthening, with significant focus on eccentric aspects.  - Low back exercises  that included:  Pelvic tilt/bracing instruction to focus on control of the pelvic girdle and lower abdominal muscles  Glute strengthening exercises, focusing on proper firing of the glutes without engaging the low back muscles Proper stretching techniques for maximum relief for the hamstrings, hip flexors, low back and some rotation where tolerated Proper technique shown and discussed handout in great detail with ATC.  All questions were discussed and answered.     Impression and Recommendations:     This case required medical decision making of moderate complexity.

## 2015-06-22 NOTE — Progress Notes (Signed)
Pre visit review using our clinic review tool, if applicable. No additional management support is needed unless otherwise documented below in the visit note. 

## 2015-06-22 NOTE — Patient Instructions (Signed)
Good to see you Prednisone daily for 5 days  Ice 20 minutes 2 times daily. Usually after activity and before bed. Exercises 3 times a week.  Vitamin D 4000 IU daily Turmeric 500mg  twice daily Iron 65mg  3 times daily Make sure protein you are getting (1 gram per pound daily) See me again in 4 weeks.

## 2015-06-22 NOTE — Assessment & Plan Note (Signed)
Does have tightness of hip flexors and weakness of abductors.  HEP, icing, Prednisone short course. Discussed protein supplementation.  Discussed vitamins and possible deficiency including iron(pt has hx) Patient will rtc in 4 weeks consider inhaler if no improvement.

## 2015-07-20 ENCOUNTER — Ambulatory Visit: Payer: Medicare Other | Admitting: Family Medicine

## 2015-07-27 ENCOUNTER — Encounter: Payer: Self-pay | Admitting: Family Medicine

## 2015-07-27 ENCOUNTER — Ambulatory Visit (INDEPENDENT_AMBULATORY_CARE_PROVIDER_SITE_OTHER): Payer: Medicare Other | Admitting: Family Medicine

## 2015-07-27 VITALS — BP 128/84 | HR 72 | Ht 69.0 in | Wt 155.0 lb

## 2015-07-27 DIAGNOSIS — M24551 Contracture, right hip: Secondary | ICD-10-CM | POA: Diagnosis not present

## 2015-07-27 NOTE — Patient Instructions (Signed)
Good to see you Duexis before and after long runs as long as it is cool out Continue the vitamins Yoga would be great after the marathon Continue the exercises 3 times a week up to marathon and 2-3 times a week thereafter.  Protein still within 30 minutes of running See me again if not feeling well be fore the race See me when you need me otherwise

## 2015-07-27 NOTE — Progress Notes (Signed)
[facility] [place],  [address] Phone: [phone] Subjective:    I'm seeing this patient by the request  of:  Mauricio Po, FNP   CC: Runner with back pain  [id number] [facility] is a 69 y.o. male coming in with complaint of back pain. Patient is found to have more of a hip flexor tightness. There is a question for patient having a potential even strain. Patient though has been doing the exercises and cross training with biking. Taking the natural supplementations. Patient states that he is feeling approximately 80% better. Patient is going to go for his first long-run tomorrow. Patient states that he has had no discomfort with the short runs. States that he is been trying to change his diet which has been more beneficial. Overall improving. No weakness.   Past Medical History  Diagnosis Date  . Allergic rhinitis   . Sinusitis   . Bronchitis   . Nonspecific abnormal electrocardiogram (ECG) (EKG)   . Diverticulosis of colon (without mention of hemorrhage)   . Benign neoplasm of colon   . Headache(784.0)   . Dysthymic disorder   . Lentigo   . Colon polyps   . HTN (hypertension)    No past surgical history on file. Social History  Substance Use Topics  . Smoking status: Former Smoker -- 0.50 packs/day for 15 years    Types: Cigarettes    Quit date: 09/30/1977  . Smokeless tobacco: Never Used  . Alcohol Use: 0.0 oz/week     Comment: 2 drinks per day   No Known Allergies Family History  Problem Relation Age of Onset  . Heart disease Father   . Healthy Sister   . Stroke Mother        Past medical history, social, surgical and family history all reviewed in electronic medical record.   Review of Systems: No headache, visual changes, nausea, vomiting, diarrhea, constipation, dizziness, abdominal pain, skin rash, fevers, chills, night sweats, weight loss, swollen lymph nodes, body aches, joint swelling, muscle  aches, chest pain, shortness of breath, mood changes.   Objective Blood pressure 128/84, pulse 72, height 5\' 9"  (1.753 m), weight 155 lb (70.308 kg), SpO2 97 %.  General: No apparent distress alert and oriented x3 mood and affect normal, dressed appropriately.  HEENT: Pupils equal, extraocular movements intact  Respiratory: Patient's speak in full sentences and does not appear short of breath  Cardiovascular: No lower extremity edema, non tender, no erythema  Skin: Warm dry intact with no signs of infection or rash on extremities or on axial skeleton.  Abdomen: Soft nontender  Neuro: Cranial nerves II through XII are intact, neurovascularly intact in all extremities with 2+ DTRs and 2+ pulses.  Lymph: No lymphadenopathy of posterior or anterior cervical chain or axillae bilaterally.  Gait normal with good balance and coordination.  MSK:  Non tender with full range of motion and good stability and symmetric strength and tone of shoulders, elbows, wrist, hip, knee and ankles bilaterally.  Back Exam:  Inspection: Unremarkable  Motion: Flexion 35 deg, Extension 25 deg, Side Bending to 35 deg bilaterally,  Rotation to 35 deg bilaterally  SLR laying: Negative  XSLR laying: Negative  Palpable tenderness: Increasing range of motion with less tightness.  FABER: negative. Sensory change: Gross sensation intact to all lumbar and sacral dermatomes.  Reflexes: 2+ at both patellar tendons, 2+ at achilles tendons, Babinski's downgoing.  Strength at foot  Plantar-flexion: 5/5 Dorsi-flexion: 5/5 Eversion:  5/5 Inversion: 5/5  Leg strength  Quad: 5/5 Hamstring: 5/5 Hip flexor: 5/5 Hip abductors: 4/5 but symmetric Gait unremarkable.      Impression and Recommendations:     This case required medical decision making of moderate complexity.

## 2015-07-27 NOTE — Progress Notes (Signed)
Pre visit review using our clinic review tool, if applicable. No additional management support is needed unless otherwise documented below in the visit note. 

## 2015-07-27 NOTE — Assessment & Plan Note (Signed)
Patient is doing remarkably well at this time. Encourage him to stay active. Encourage him to continue to do the exercises after running. We discussed with patient about the importance of the protein supplementation. We discussed the icing regimen. Patient will continue with the supplementations. Patient will come back and see me again in 3 weeks with the right before his marathons. If having any pain we'll consider osteopathic manipulation further workup. Patient did respond very well to prednisone previously.

## 2015-08-16 ENCOUNTER — Ambulatory Visit: Payer: Medicare Other | Admitting: Family Medicine

## 2015-09-11 ENCOUNTER — Telehealth: Payer: Self-pay | Admitting: Family

## 2015-09-11 DIAGNOSIS — M545 Low back pain, unspecified: Secondary | ICD-10-CM

## 2015-09-11 NOTE — Telephone Encounter (Signed)
Patient would like for Dr. Tamala Julian to give him a call concerning his back pain during the marathon.

## 2015-09-11 NOTE — Telephone Encounter (Signed)
lmovm for pt to return call.  

## 2015-09-12 NOTE — Telephone Encounter (Signed)
Spoke to pt, he states that he is running his next marathon in march & he would like to get this back pain under control before then. He states that he only has back pain while he's running & if he runs 15 miles or more. He states that he doesn't have any back pain at any other time. He wants to know if he needs to come in to have x rays done or if there is something that he can take to help with the pain while he's running. He said that when he took prednisone last time he could run with out pain for 2-3 weeks after taking the prednisone.

## 2015-09-13 NOTE — Telephone Encounter (Signed)
I do think an xray could be good.  We could order that if he wants.  Also could take meloxicam daily for 10 days then as needed If he wants please send in  If not better see me after the new year to discuss.

## 2015-09-14 ENCOUNTER — Telehealth: Payer: Self-pay | Admitting: *Deleted

## 2015-09-14 MED ORDER — AMLODIPINE BESYLATE 10 MG PO TABS: 10.0000 mg | ORAL_TABLET | Freq: Every day | ORAL | Status: DC

## 2015-09-14 MED ORDER — MELOXICAM 15 MG PO TABS: 15.0000 mg | ORAL_TABLET | Freq: Every day | ORAL | Status: DC

## 2015-09-14 MED ORDER — LOSARTAN POTASSIUM 100 MG PO TABS: 100.0000 mg | ORAL_TABLET | Freq: Every day | ORAL | Status: DC

## 2015-09-14 NOTE — Telephone Encounter (Signed)
Discussed with pt, x ray order entered, & meloxicam sent in pharmacy.

## 2015-09-14 NOTE — Telephone Encounter (Signed)
Left msg on triage needing BP meds refilled.Notified pt rx sen to [facility]...Travis Bell

## 2015-09-15 ENCOUNTER — Ambulatory Visit (INDEPENDENT_AMBULATORY_CARE_PROVIDER_SITE_OTHER)
Admission: RE | Admit: 2015-09-15 | Discharge: 2015-09-15 | Disposition: A | Discharge status: Home / Self Care | Payer: Medicare Other | Source: Ambulatory Visit | Attending: Family Medicine | Admitting: Family Medicine

## 2015-09-15 DIAGNOSIS — M545 Low back pain, unspecified: Secondary | ICD-10-CM

## 2015-09-18 ENCOUNTER — Other Ambulatory Visit: Payer: Self-pay | Admitting: Pulmonary Disease

## 2015-11-19 ENCOUNTER — Other Ambulatory Visit: Payer: Self-pay | Admitting: Family Medicine

## 2015-11-20 ENCOUNTER — Telehealth: Payer: Self-pay | Admitting: Family Medicine

## 2015-11-20 MED ORDER — PREDNISONE 50 MG PO TABS: 50.0000 mg | ORAL_TABLET | Freq: Every day | ORAL | Status: DC

## 2015-11-20 NOTE — Telephone Encounter (Signed)
Patient would like to have prednisone prescribed.

## 2015-11-20 NOTE — Telephone Encounter (Signed)
Discussed with pt

## 2015-11-20 NOTE — Telephone Encounter (Signed)
Sent in medicine for next 5 days but if not better in 2 weeks I need to see him.

## 2015-11-20 NOTE — Telephone Encounter (Signed)
Refill done.  

## 2015-12-23 ENCOUNTER — Other Ambulatory Visit: Payer: Self-pay | Admitting: Family Medicine

## 2015-12-25 NOTE — Telephone Encounter (Signed)
Refill done.  

## 2016-04-22 ENCOUNTER — Ambulatory Visit (INDEPENDENT_AMBULATORY_CARE_PROVIDER_SITE_OTHER): Payer: Medicare Other | Admitting: Family

## 2016-04-22 ENCOUNTER — Encounter: Payer: Self-pay | Admitting: Family

## 2016-04-22 ENCOUNTER — Other Ambulatory Visit: Payer: Medicare Other

## 2016-04-22 VITALS — BP 144/92 | HR 84 | Temp 98.2°F | Resp 16 | Ht 69.0 in | Wt 157.0 lb

## 2016-04-22 DIAGNOSIS — Z0000 Encounter for general adult medical examination without abnormal findings: Secondary | ICD-10-CM

## 2016-04-22 DIAGNOSIS — C44722 Squamous cell carcinoma of skin of right lower limb, including hip: Secondary | ICD-10-CM | POA: Diagnosis not present

## 2016-04-22 DIAGNOSIS — C4492 Squamous cell carcinoma of skin, unspecified: Secondary | ICD-10-CM

## 2016-04-22 DIAGNOSIS — Z0001 Encounter for general adult medical examination with abnormal findings: Secondary | ICD-10-CM

## 2016-04-22 DIAGNOSIS — L989 Disorder of the skin and subcutaneous tissue, unspecified: Secondary | ICD-10-CM | POA: Insufficient documentation

## 2016-04-22 HISTORY — DX: Squamous cell carcinoma of skin, unspecified: C44.92

## 2016-04-22 MED ORDER — PREDNISONE 50 MG PO TABS: 50.0000 mg | ORAL_TABLET | Freq: Every day | ORAL | 1 refills | Status: DC

## 2016-04-22 NOTE — Progress Notes (Signed)
Subjective:    Patient ID: Travis Bell, male    DOB: 09-30-1946, 70 y.o.   MRN: [id number]  Chief Complaint  Patient presents with  . CPE    not fasting, wants sore checked on right leg    HPI:  Travis Bell is a 70 y.o. male who presents today for an annual wellness visit.   1) Health Maintenance -   Diet - Averages about 4-5 small meals per day of a regular diet; Little processed/fast food; Caffeine intake of about 3-4 cups per day  Exercise - Distance running daily  2) Preventative Exams / Immunizations:  Dental -- Up to date  Vision -- Due for exam   Health Maintenance  Topic Date Due  . Hepatitis C Screening  16-Oct-1945  . PNA vac Low Risk Adult (2 of 2 - PPSV23) 01/14/2015  . INFLUENZA VACCINE  04/30/2016  . COLONOSCOPY  02/28/2018  . TETANUS/TDAP  12/09/2019  . ZOSTAVAX  Addressed     Immunization History  Administered Date(s) Administered  . Influenza Split 07/31/2012  . Influenza-Unspecified 07/31/2013, 08/30/2014, 09/16/2015  . Pneumococcal Conjugate-13 01/13/2014  . Td 12/08/2009    RISK FACTORS  Tobacco History  Smoking Status  . Former Smoker  . Packs/day: 0.50  . Years: 15.00  . Types: Cigarettes  . Quit date: 09/30/1977  Smokeless Tobacco  . Never Used     Cardiac risk factors: advanced age (older than 52 for men, 10 for women), dyslipidemia, hypertension and male gender.  Depression Screen  Q1: Over the past two weeks, have you felt down, depressed or hopeless? No  Q2: Over the past two weeks, have you felt little interest or pleasure in doing things? No  Have you lost interest or pleasure in daily life? No  Do you often feel hopeless? No  Do you cry easily over simple problems? No  Activities of Daily Living In your present state of health, do you have any difficulty performing the following activities?:  Driving? No Managing money?  No Feeding yourself? No Getting from bed to chair? No Climbing a flight of stairs?  No Preparing food and eating?: No Bathing or showering? No Getting dressed: No Getting to the toilet? No Using the toilet: No Moving around from place to place: No In the past year have you fallen or had a near fall?:No   Home Safety Has smoke detector and wears seat belts. No firearms. No excess sun exposure. Are there smokers in your home (other than you)?  No Do you feel safe at home?  Yes  Hearing Difficulties: No Do you often ask people to speak up or repeat themselves? No Do you experience ringing or noises in your ears? No  Do you have difficulty understanding soft or whispered voices? No    Cognitive Testing  Alert? Yes   Normal Appearance? Yes  Oriented to person? Yes  Place? Yes   Time? Yes  Recall of three objects?  Yes  Can perform simple calculations? Yes  Displays appropriate judgment? Yes  Can read the correct time from a watch face? Yes  Do you feel that you have a problem with memory? No  Do you often misplace items? No   Advanced Directives have been discussed with the patient? Yes  Current Physicians/Providers and Suppliers  1. Terri Piedra, FNP - Internal Medicine 2. Teressa Lower, MD - Pulmonology   Indicate any recent Medical Services you may have received from other than [facility] providers in the  past year (date may be approximate).  All answers were reviewed with the patient and necessary referrals were made:  Mauricio Po, [place]   04/22/2016    No Known Allergies   Outpatient Medications Prior to Visit  Medication Sig Dispense Refill  . amLODipine (NORVASC) 10 MG tablet Take 1 tablet (10 mg total) by mouth daily. 90 tablet 3  . Calcium Carbonate-Vitamin D (CALCIUM + D PO) Take by mouth daily.    . fish oil-omega-3 fatty acids 1000 MG capsule Take 2 g by mouth daily.    Marland Kitchen losartan (COZAAR) 100 MG tablet Take 1 tablet (100 mg total) by mouth daily. 90 tablet 3  . Multiple Vitamins-Minerals (MULTIVITAMIN PO) Take by mouth daily.    . Nutritional  Supplements (JOINT FORMULA PO) Take by mouth daily.    . meloxicam (MOBIC) 15 MG tablet TAKE 1 TABLET(15 MG) BY MOUTH DAILY 30 tablet 0  . predniSONE (DELTASONE) 50 MG tablet Take 1 tablet (50 mg total) by mouth daily. 5 tablet 0   No facility-administered medications prior to visit.      Past Medical History:  Diagnosis Date  . Allergic rhinitis   . Benign neoplasm of colon   . Bronchitis   . Colon polyps   . Diverticulosis of colon (without mention of hemorrhage)   . Dysthymic disorder   . Headache(784.0)   . HTN (hypertension)   . Lentigo   . Nonspecific abnormal electrocardiogram (ECG) (EKG)   . Sinusitis      No past surgical history on file.   Family History  Problem Relation Age of Onset  . Heart disease Father   . Healthy Sister   . Stroke Mother      Social History   Social History  . Marital status: Married    Spouse name: [name]  . Number of children: 1  . Years of education: 69   Occupational History  . retired     Psychologist, sport and exercise  .  [place] Information   Social History Main Topics  . Smoking status: Former Smoker    Packs/day: 0.50    Years: 15.00    Types: Cigarettes    Quit date: 09/30/1977  . Smokeless tobacco: Never Used  . Alcohol use 0.0 oz/week     Comment: 2 drinks per day  . Drug use: No  . Sexual activity: Not on file   Other Topics Concern  . Not on file   Social History Narrative   Fun: Run, travel   Denies religious beliefs effecting health care.      Review of Systems  Constitutional: Denies fever, chills, fatigue, or significant weight gain/loss. HENT: Head: Denies headache or neck pain Ears: Denies changes in hearing, ringing in ears, earache, drainage Nose: Denies discharge, stuffiness, itching, nosebleed, sinus pain Throat: Denies sore throat, hoarseness, dry mouth, sores, thrush Eyes: Denies loss/changes in vision, pain, redness, blurry/double vision, flashing lights Cardiovascular: Denies chest pain/discomfort,  tightness, palpitations, shortness of breath with activity, difficulty lying down, swelling, sudden awakening with shortness of breath Respiratory: Denies shortness of breath, cough, sputum production, wheezing Gastrointestinal: Denies dysphasia, heartburn, change in appetite, nausea, change in bowel habits, rectal bleeding, constipation, diarrhea, yellow skin or eyes Genitourinary: Denies frequency, urgency, burning/pain, blood in urine, incontinence, change in urinary strength. Musculoskeletal: Denies muscle/joint pain, stiffness, back pain, redness or swelling of joints, trauma Skin: Denies rashes, lumps, itching, dryness, color changes, or hair/nail changes Neurological: Denies dizziness, fainting, seizures, weakness, numbness, tingling, tremor Psychiatric - Denies  nervousness, stress, depression or memory loss Endocrine: Denies heat or cold intolerance, sweating, frequent urination, excessive thirst, changes in appetite Hematologic: Denies ease of bruising or bleeding    Objective:     BP (!) 144/92 (BP Location: Left Arm, Patient Position: Sitting, Cuff Size: Normal)   Pulse 84   Temp 98.2 F (36.8 C) (Oral)   Resp 16   Ht 5\' 9"  (1.753 m)   Wt 157 lb (71.2 kg)   SpO2 98%   BMI 23.18 kg/m  Nursing note and vital signs reviewed.  Physical Exam  Constitutional: He is oriented to person, place, and time. He appears well-developed and well-nourished.  HENT:  Head: Normocephalic.  Right Ear: Hearing, tympanic membrane, external ear and ear canal normal.  Left Ear: Hearing, tympanic membrane, external ear and ear canal normal.  Nose: Nose normal.  Mouth/Throat: Uvula is midline, oropharynx is clear and moist and mucous membranes are normal.  Eyes: Conjunctivae and EOM are normal. Pupils are equal, round, and reactive to light.  Neck: Neck supple. No JVD present. No tracheal deviation present. No thyromegaly present.  Cardiovascular: Normal rate, regular rhythm, normal heart sounds  and intact distal pulses.   Pulmonary/Chest: Effort normal and breath sounds normal.  Abdominal: Soft. Bowel sounds are normal. He exhibits no distension and no mass. There is no tenderness. There is no rebound and no guarding.  Musculoskeletal: Normal range of motion. He exhibits no edema or tenderness.  Lymphadenopathy:    He has no cervical adenopathy.  Neurological: He is alert and oriented to person, place, and time. He has normal reflexes. No cranial nerve deficit. He exhibits normal muscle tone. Coordination normal.  Skin: Skin is warm and dry.  Approximately 1/2 in annular raised pink lesion with clear defined borders and lightly tender to the touch. No discharge appreciated.   Psychiatric: He has a normal mood and affect. His behavior is normal. Judgment and thought content normal.   Procedure note: Shave biopsy of skin lesion   Informed consent was obtained with discussion including infection, scarring, and need for additional procedure. Patient verbally knowledged risks and wish to continue. Timeout was performed. The right thigh was prepped with Betadine planning concentric circles outwards from the lesion and allow to dry. Skin anesthesia was applied using cold spray to allow for anesthesia with lidocaine with epinephrine. Adequate anesthesia was noted prior to continued procedure. Lesion was incised with a #11 blade with mild amount of white thick discharge. Site was explored for loculations with no found. Then rinsed thoroughly with saline. It was determined to perform a shave biopsy of remaining skin tissue to confirm lesion is benign. Shave was performed and sent for biopsy. Site was closed with one stitch. Procedure was tolerated with no complications and minimal blood loss. Post-care instructions and signs of infection were reviewed.    Assessment & Plan:   During the course of the visit the patient was educated and counseled about appropriate screening and preventive services  including:    Pneumococcal vaccine   Influenza vaccine  Prostate cancer screening  Colorectal cancer screening  Nutrition counseling   Diet review for nutrition referral? Yes ____  Not Indicated _X___   Patient Instructions (the written plan) was given to the patient.  Medicare Attestation I have personally reviewed: The patient's medical and social history Their use of alcohol, tobacco or illicit drugs Their current medications and supplements The patient's functional ability including ADLs,fall risks, home safety risks, cognitive, and hearing and visual impairment  Diet and physical activities Evidence for depression or mood disorders  The patient's weight, height, BMI,  have been recorded in the chart.  I have made referrals, counseling, and provided education to the patient based on review of the above and I have provided the patient with a written personalized care plan for preventive services.     Mauricio Po, [place]   04/22/2016    Problem List Items Addressed This Visit      Musculoskeletal and Integument   Benign skin lesion of thigh    Lesion of right upper thigh consistent with possible cyst when attempted to drain with minimal drainage not and site biopsied. Home care instructions provided and will monitor for signs of infection. Follow up in 7 days for stitch removal and pending biopsy.       Relevant Orders   Dermatology pathology     Other   Encounter for general adult medical examination with abnormal findings    1) Anticipatory Guidance: Discussed importance of wearing a seatbelt while driving and not texting while driving; changing batteries in smoke detector at least once annually; wearing suntan lotion when outside; eating a balanced and moderate diet; getting physical activity at least 30 minutes per day.  2) Immunizations / Screenings / Labs:  Due for pneumonia vaccination which will be scheduled. All other immunizations are up-to-date per  recommendations. Due for a vision exam encouraged to be completed independently. Obtain hepatitis C antibody for hepatitis C screening. Obtain PSA for prostate cancer screening. All other screenings are up-to-date per recommendations. Obtain CBC, CMET, and Lipid profile.   Overall well exam with risk factors for cardiovascular disease including hypertension and well-controlled hyperlipidemia. Chronic conditions are managed with medications with no adverse side effects. Overall he has a very healthy exam and is of good weight with good physical activity and nutritional intake. Continue healthy lifestyle behaviors and choices. Follow-up prevention exam in 1 year. Follow-up office visit pending blood work as necessary.      Relevant Orders   CBC   Comprehensive metabolic panel   Lipid panel   Hepatitis C antibody   PSA   Medicare annual wellness visit, subsequent - Primary    Reviewed and updated patient's medical, surgical, family and social history. Medications and allergies were also reviewed. Basic screenings for depression, activities of daily living, hearing, cognition and safety were performed. Provider list was updated and health plan was provided to the patient.        Other Visit Diagnoses   None.

## 2016-04-22 NOTE — Patient Instructions (Addendum)
Thank you for choosing [facility].  Summary/Instructions:  Please keep the site clean and dry.  Cover with a band-aid  Monitor for signs of infection.  Follow up in 7 days for stitch removal.   Please stop by the lab on the lower level of the building for your blood work. Your results will be released to [facility] (or called to you) after review, usually within 72 hours after test completion. If any changes need to be made, you will be notified at that same time.  1. The lab is open from 7:30am to 5:30 pm Monday-Friday  2. No appointment is necessary  3. Fasting (if needed) is 6-8 hours after food and drink; black coffee and water  are okay   If your symptoms worsen or fail to improve, please contact our office for further instruction, or in case of emergency go directly to the emergency room at the closest medical facility.   Health Maintenance, Male A healthy lifestyle and preventative care can promote health and wellness.  Maintain regular health, dental, and eye exams.  Eat a healthy diet. Foods like vegetables, fruits, whole grains, low-fat dairy products, and lean protein foods contain the nutrients you need and are low in calories. Decrease your intake of foods high in solid fats, added sugars, and salt. Get information about a proper diet from your health care provider, if necessary.  Regular physical exercise is one of the most important things you can do for your health. Most adults should get at least 150 minutes of moderate-intensity exercise (any activity that increases your heart rate and causes you to sweat) each week. In addition, most adults need muscle-strengthening exercises on 2 or more days a week.   Maintain a healthy weight. The body mass index (BMI) is a screening tool to identify possible weight problems. It provides an estimate of body fat based on height and weight. Your health care provider can find your BMI and can help you achieve or maintain a healthy  weight. For males 20 years and older:  A BMI below 18.5 is considered underweight.  A BMI of 18.5 to 24.9 is normal.  A BMI of 25 to 29.9 is considered overweight.  A BMI of 30 and above is considered obese.  Maintain normal blood lipids and cholesterol by exercising and minimizing your intake of saturated fat. Eat a balanced diet with plenty of fruits and vegetables. Blood tests for lipids and cholesterol should begin at age 29 and be repeated every 5 years. If your lipid or cholesterol levels are high, you are over age 5, or you are at high risk for heart disease, you may need your cholesterol levels checked more frequently.Ongoing high lipid and cholesterol levels should be treated with medicines if diet and exercise are not working.  If you smoke, find out from your health care provider how to quit. If you do not use tobacco, do not start.  Lung cancer screening is recommended for adults aged 50-80 years who are at high risk for developing lung cancer because of a history of smoking. A yearly low-dose CT scan of the lungs is recommended for people who have at least a 30-pack-year history of smoking and are current smokers or have quit within the past 15 years. A pack year of smoking is smoking an average of 1 pack of cigarettes a day for 1 year (for example, a 30-pack-year history of smoking could mean smoking 1 pack a day for 30 years or 2 packs a  day for 15 years). Yearly screening should continue until the smoker has stopped smoking for at least 15 years. Yearly screening should be stopped for people who develop a health problem that would prevent them from having lung cancer treatment.  If you choose to drink alcohol, do not have more than 2 drinks per day. One drink is considered to be 12 oz (360 mL) of beer, 5 oz (150 mL) of wine, or 1.5 oz (45 mL) of liquor.  Avoid the use of street drugs. Do not share needles with anyone. Ask for help if you need support or instructions about stopping  the use of drugs.  High blood pressure causes heart disease and increases the risk of stroke. High blood pressure is more likely to develop in:  People who have blood pressure in the end of the normal range (100-139/85-89 mm Hg).  People who are overweight or obese.  People who are African American.  If you are 63-90 years of age, have your blood pressure checked every 3-5 years. If you are 84 years of age or older, have your blood pressure checked every year. You should have your blood pressure measured twice--once when you are at a hospital or clinic, and once when you are not at a hospital or clinic. Record the average of the two measurements. To check your blood pressure when you are not at a hospital or clinic, you can use:  An automated blood pressure machine at a pharmacy.  A home blood pressure monitor.  If you are 70-17 years old, ask your health care provider if you should take aspirin to prevent heart disease.  Diabetes screening involves taking a blood sample to check your fasting blood sugar level. This should be done once every 3 years after age 12 if you are at a normal weight and without risk factors for diabetes. Testing should be considered at a younger age or be carried out more frequently if you are overweight and have at least 1 risk factor for diabetes.  Colorectal cancer can be detected and often prevented. Most routine colorectal cancer screening begins at the age of 66 and continues through age 102. However, your health care provider may recommend screening at an earlier age if you have risk factors for colon cancer. On a yearly basis, your health care provider may provide home test kits to check for hidden blood in the stool. A small camera at the end of a tube may be used to directly examine the colon (sigmoidoscopy or colonoscopy) to detect the earliest forms of colorectal cancer. Talk to your health care provider about this at age 10 when routine screening begins. A  direct exam of the colon should be repeated every 5-10 years through age 58, unless early forms of precancerous polyps or small growths are found.  People who are at an increased risk for hepatitis B should be screened for this virus. You are considered at high risk for hepatitis B if:  You were born in a country where hepatitis B occurs often. Talk with your health care provider about which countries are considered high risk.  Your parents were born in a high-risk country and you have not received a shot to protect against hepatitis B (hepatitis B vaccine).  You have HIV or AIDS.  You use needles to inject street drugs.  You live with, or have sex with, someone who has hepatitis B.  You are a man who has sex with other men (MSM).  You get  hemodialysis treatment.  You take certain medicines for conditions like cancer, organ transplantation, and autoimmune conditions.  Hepatitis C blood testing is recommended for all people born from [birth year] through 1963 and any individual with known risk factors for hepatitis C.  Healthy men should no longer receive prostate-specific antigen (PSA) blood tests as part of routine cancer screening. Talk to your health care provider about prostate cancer screening.  Testicular cancer screening is not recommended for adolescents or adult males who have no symptoms. Screening includes self-exam, a health care provider exam, and other screening tests. Consult with your health care provider about any symptoms you have or any concerns you have about testicular cancer.  Practice safe sex. Use condoms and avoid high-risk sexual practices to reduce the spread of sexually transmitted infections (STIs).  You should be screened for STIs, including gonorrhea and chlamydia if:  You are sexually active and are younger than 24 years.  You are older than 24 years, and your health care provider tells you that you are at risk for this type of infection.  Your sexual  activity has changed since you were last screened, and you are at an increased risk for chlamydia or gonorrhea. Ask your health care provider if you are at risk.  If you are at risk of being infected with HIV, it is recommended that you take a prescription medicine daily to prevent HIV infection. This is called pre-exposure prophylaxis (PrEP). You are considered at risk if:  You are a man who has sex with other men (MSM).  You are a heterosexual man who is sexually active with multiple partners.  You take drugs by injection.  You are sexually active with a partner who has HIV.  Talk with your health care provider about whether you are at high risk of being infected with HIV. If you choose to begin PrEP, you should first be tested for HIV. You should then be tested every 3 months for as long as you are taking PrEP.  Use sunscreen. Apply sunscreen liberally and repeatedly throughout the day. You should seek shade when your shadow is shorter than you. Protect yourself by wearing long sleeves, pants, a wide-brimmed hat, and sunglasses year round whenever you are outdoors.  Tell your health care provider of new moles or changes in moles, especially if there is a change in shape or color. Also, tell your health care provider if a mole is larger than the size of a pencil eraser.  A one-time screening for abdominal aortic aneurysm (AAA) and surgical repair of large AAAs by ultrasound is recommended for men aged 38-75 years who are current or former smokers.  Stay current with your vaccines (immunizations).   This information is not intended to replace advice given to you by your health care provider. Make sure you discuss any questions you have with your health care provider.   Document Released: 03/14/2008 Document Revised: 10/07/2014 Document Reviewed: 02/11/2011 Elsevier Interactive Patient Education 2016 [place] Maintenance  Topic Date Due  . Hepatitis C Screening  02-14-1946   . PNA vac Low Risk Adult (2 of 2 - PPSV23) 01/14/2015  . INFLUENZA VACCINE  04/30/2016  . COLONOSCOPY  02/28/2018  . TETANUS/TDAP  12/09/2019  . ZOSTAVAX  Addressed

## 2016-04-22 NOTE — Assessment & Plan Note (Signed)
Reviewed and updated patient's medical, surgical, family and social history. Medications and allergies were also reviewed. Basic screenings for depression, activities of daily living, hearing, cognition and safety were performed. Provider list was updated and health plan was provided to the patient.  

## 2016-04-22 NOTE — Assessment & Plan Note (Signed)
1) Anticipatory Guidance: Discussed importance of wearing a seatbelt while driving and not texting while driving; changing batteries in smoke detector at least once annually; wearing suntan lotion when outside; eating a balanced and moderate diet; getting physical activity at least 30 minutes per day.  2) Immunizations / Screenings / Labs:  Due for pneumonia vaccination which will be scheduled. All other immunizations are up-to-date per recommendations. Due for a vision exam encouraged to be completed independently. Obtain hepatitis C antibody for hepatitis C screening. Obtain PSA for prostate cancer screening. All other screenings are up-to-date per recommendations. Obtain CBC, CMET, and Lipid profile.   Overall well exam with risk factors for cardiovascular disease including hypertension and well-controlled hyperlipidemia. Chronic conditions are managed with medications with no adverse side effects. Overall he has a very healthy exam and is of good weight with good physical activity and nutritional intake. Continue healthy lifestyle behaviors and choices. Follow-up prevention exam in 1 year. Follow-up office visit pending blood work as necessary.

## 2016-04-22 NOTE — Assessment & Plan Note (Signed)
Lesion of right upper thigh consistent with possible cyst when attempted to drain with minimal drainage not and site biopsied. Home care instructions provided and will monitor for signs of infection. Follow up in 7 days for stitch removal and pending biopsy.

## 2016-04-23 ENCOUNTER — Other Ambulatory Visit: Payer: Medicare Other

## 2016-04-24 ENCOUNTER — Other Ambulatory Visit (INDEPENDENT_AMBULATORY_CARE_PROVIDER_SITE_OTHER): Payer: Medicare Other

## 2016-04-24 DIAGNOSIS — Z125 Encounter for screening for malignant neoplasm of prostate: Secondary | ICD-10-CM

## 2016-04-24 DIAGNOSIS — Z0001 Encounter for general adult medical examination with abnormal findings: Secondary | ICD-10-CM

## 2016-04-24 DIAGNOSIS — R6889 Other general symptoms and signs: Secondary | ICD-10-CM

## 2016-04-24 LAB — LIPID PANEL
CHOL/HDL RATIO: 3
Cholesterol: 202 mg/dL — ABNORMAL HIGH (ref 0–200)
HDL: 58.5 mg/dL (ref >39.00)
LDL CALC: 127 mg/dL — AB (ref 0–99)
NONHDL: 143.59
Triglycerides: 84 mg/dL (ref 0.0–149.0)
VLDL: 16.8 mg/dL (ref 0.0–40.0)

## 2016-04-24 LAB — CBC
HEMATOCRIT: 42.7 % (ref 39.0–52.0)
HEMOGLOBIN: 14.6 g/dL (ref 13.0–17.0)
MCHC: 34.1 g/dL (ref 30.0–36.0)
MCV: 92 fl (ref 78.0–100.0)
Platelets: 283 10*3/uL (ref 150.0–400.0)
RBC: 4.64 Mil/uL (ref 4.22–5.81)
RDW: 13.5 % (ref 11.5–15.5)
WBC: 5.6 10*3/uL (ref 4.0–10.5)

## 2016-04-24 LAB — COMPREHENSIVE METABOLIC PANEL
ALK PHOS: 41 U/L (ref 39–117)
ALT: 18 U/L (ref 0–53)
AST: 20 U/L (ref 0–37)
Albumin: 4.4 g/dL (ref 3.5–5.2)
BUN: 28 mg/dL — AB (ref 6–23)
CHLORIDE: 103 meq/L (ref 96–112)
CO2: 27 meq/L (ref 19–32)
Calcium: 9.6 mg/dL (ref 8.4–10.5)
Creatinine, Ser: 0.93 mg/dL (ref 0.40–1.50)
GFR: 85.94 mL/min (ref >60.00)
GLUCOSE: 105 mg/dL — AB (ref 70–99)
POTASSIUM: 4.4 meq/L (ref 3.5–5.1)
SODIUM: 137 meq/L (ref 135–145)
Total Bilirubin: 0.8 mg/dL (ref 0.2–1.2)
Total Protein: 6.5 g/dL (ref 6.0–8.3)

## 2016-04-24 LAB — PSA: PSA: 2.66 ng/mL (ref 0.10–4.00)

## 2016-04-25 ENCOUNTER — Encounter: Payer: Self-pay | Admitting: Family

## 2016-04-25 LAB — HEPATITIS C ANTIBODY: HCV AB: NEGATIVE

## 2016-04-29 ENCOUNTER — Ambulatory Visit (INDEPENDENT_AMBULATORY_CARE_PROVIDER_SITE_OTHER): Payer: Medicare Other | Admitting: Family

## 2016-04-29 ENCOUNTER — Encounter: Payer: Self-pay | Admitting: Family

## 2016-04-29 VITALS — BP 138/82 | HR 75 | Resp 16 | Ht 69.0 in | Wt 157.0 lb

## 2016-04-29 DIAGNOSIS — D0472 Carcinoma in situ of skin of left lower limb, including hip: Secondary | ICD-10-CM | POA: Insufficient documentation

## 2016-04-29 DIAGNOSIS — Z23 Encounter for immunization: Secondary | ICD-10-CM | POA: Diagnosis not present

## 2016-04-29 DIAGNOSIS — IMO0002 Reserved for concepts with insufficient information to code with codable children: Secondary | ICD-10-CM | POA: Insufficient documentation

## 2016-04-29 DIAGNOSIS — C801 Malignant (primary) neoplasm, unspecified: Secondary | ICD-10-CM

## 2016-04-29 NOTE — Patient Instructions (Addendum)
Thank you for choosing [facility].  Summary/Instructions:  They will call to schedule your appointment with dermatology.  If your symptoms worsen or fail to improve, please contact our office for further instruction, or in case of emergency go directly to the emergency room at the closest medical facility.    Squamous Cell Carcinoma Squamous cell carcinoma is a common form of skin cancer. It begins in the squamous cells in the outer layer of the skin (epidermis). It occurs most often in parts of the body that are frequently exposed to the sun, such as the face, lips, neck, arms, legs, and hands. However, this condition can occur anywhere on the body, including the inside of the mouth, sites of long-term (chronic) scarring, and the anus. If squamous cell carcinoma is treated soon enough, it rarely spreads to other areas of the body (metastasizes). If it is not treated, it can destroy nearby tissues. In rare cases, it can spread to other areas. CAUSES This condition is usually caused by exposure to ultraviolet (UV) light. UV light may come from the sun or from tanning beds. Other causes include:  Exposure to arsenic.  Exposure to radiation.  Exposure to toxic tars and oils.  Certain genetic conditions, such as xeroderma pigmentosum. RISK FACTORS This condition is more likely to develop in:  People who are older than 68 years of age.  People who have fair skin (light complexion).  People who have blonde or red hair.  People who have blue, green, or gray eyes.  People who have childhood freckling.  People who have had sun exposure over long periods of time, especially during childhood.  People who have had repeated sunburns.  People who have a weakened immune system.  People who have been exposed to certain chemicals, such as tar, soot, and arsenic.  People who have chronic inflammatory conditions.  People who have chronic infections.  People who have an HPV (human  papillomavirus) infection.  People who have conditions that cause chronic scarring. These can include burn scars, chronic ulcers, heat (thermal) injuries, and radiation.  People who have had psoralen and ultraviolet A (PUVA) treatments.  People who smoke.  People who use tanning beds. SYMPTOMS This condition often starts as small pink or brown growths on the skin. The growths have a rough surface that may feel like sandpaper. In some cases, the growths are easier to feel than to see. The growths may develop into a sore that does not heal. DIAGNOSIS This condition may be diagnosed with:  A physical exam.  Removal of a tissue sample to be examined under a microscope (biopsy). TREATMENT Treatment for this condition involves removing the cancerous tissue. The method that is used for this depends on the size and location of the tumors, as well as your overall health. Possible treatments include:  Mohs surgery. In this procedure, the cancerous skin cells are removed layer by layer until all of the tumor has been removed.  Surgical removal (excision) of the tumor. This involves removing the entire tumor and a small amount of normal skin that surrounds it.  Cryosurgery. This involves freezing the tumor with liquid nitrogen.  Plastic surgery. The tumor is removed, and healthy skin from another part of the body is used to cover the wound. This may be done for large tumors that are in areas where it is not possible to stretch the nearby skin to sew the edges of the wound together.  Radiation. This may be used for tumors on the  face.  Photodynamic therapy. A chemical cream is applied to the skin, and light exposure is used to activate the chemical.  Electrodesiccation and curettage. This involves alternately scraping and burning the tumor while using an electric current to control bleeding. HOME CARE INSTRUCTIONS  Avoid unprotected sun exposure.  Do self-exams as told by your health care  provider. Look for new spots or changes in your skin.  Keep all follow-up visits as told by your health care provider. This is important.  Do not use tobacco products, including cigarettes, chewing tobacco, or e-cigarettes. If you need help quitting, ask your health care provider. PREVENTION  Avoid the sun when it is the strongest. This is usually between 10:00 a.m. and 4:00 p.m.  When you are out in the sun, use a sunscreen that has a sun protection factor (SPF) of at least 20.  Apply sunscreen at least 30 minutes before exposure to the sun.  Reapply sunscreen every 2-4 hours while you are outside. Also reapply it after swimming and after excessive sweating.  Always wear hats, protective clothing, and UV-blocking sunglasses when you are outdoors.  Do not use tanning beds. SEEK MEDICAL CARE IF:  You notice any new growths or any changes in your skin.  You have had a squamous cell carcinoma tumor removed and you notice a new growth in the same location.   This information is not intended to replace advice given to you by your health care provider. Make sure you discuss any questions you have with your health care provider.   Document Released: 03/23/2003 Document Revised: 06/07/2015 Document Reviewed: 01/09/2015 Elsevier Interactive Patient Education [employer].

## 2016-04-29 NOTE — Assessment & Plan Note (Signed)
Stitch removed without complication or sign of infection. Biopsy positive for squamous cell carcinoma. Referral to dermatology placed for additional follow up and treatment.

## 2016-04-29 NOTE — Progress Notes (Signed)
   Subjective:    Patient ID: Travis Bell, male    DOB: [birth year], 70 y.o.   MRN: [id number]  Chief Complaint  Patient presents with  . Suture / Staple Removal    HPI:  Travis Bell is a 70 y.o. male who  has a past medical history of Allergic rhinitis; Benign neoplasm of colon; Bronchitis; Colon polyps; Diverticulosis of colon (without mention of hemorrhage); Dysthymic disorder; Headache(784.0); HTN (hypertension); Lentigo; Nonspecific abnormal electrocardiogram (ECG) (EKG); and Sinusitis. and presents today for a follow up office visit.  Previously evaluated in the office for lesion of his right thigh that was biopsied with the the result being squamous cell carcinoma. He is here to have the stitch removed and a wound check.  No Known Allergies   Current Outpatient Prescriptions on File Prior to Visit  Medication Sig Dispense Refill  . amLODipine (NORVASC) 10 MG tablet Take 1 tablet (10 mg total) by mouth daily. 90 tablet 3  . Calcium Carbonate-Vitamin D (CALCIUM + D PO) Take by mouth daily.    . fish oil-omega-3 fatty acids 1000 MG capsule Take 2 g by mouth daily.    Marland Kitchen losartan (COZAAR) 100 MG tablet Take 1 tablet (100 mg total) by mouth daily. 90 tablet 3  . Multiple Vitamins-Minerals (MULTIVITAMIN PO) Take by mouth daily.    . Nutritional Supplements (JOINT FORMULA PO) Take by mouth daily.    . predniSONE (DELTASONE) 50 MG tablet Take 1 tablet (50 mg total) by mouth daily. 5 tablet 1   No current facility-administered medications on file prior to visit.     Review of Systems  Constitutional: Negative for chills and fever.  Skin: Positive for wound.      Objective:    BP 138/82 (BP Location: Left Arm, Patient Position: Sitting, Cuff Size: Normal)   Pulse 75   Resp 16   Ht 5\' 9"  (1.753 m)   Wt 157 lb (71.2 kg)   SpO2 99%   BMI 23.18 kg/m  Nursing note and vital signs reviewed.  Physical Exam  Constitutional: He is oriented to person, place, and time. He appears  well-developed and well-nourished. No distress.  Cardiovascular: Normal rate, regular rhythm, normal heart sounds and intact distal pulses.   Pulmonary/Chest: Effort normal and breath sounds normal.  Neurological: He is alert and oriented to person, place, and time.  Skin: Skin is warm and dry.  Wound appears well healed with 1 stitch in place and no evidence of infection.   Psychiatric: He has a normal mood and affect. His behavior is normal. Judgment and thought content normal.       Assessment & Plan:   Problem List Items Addressed This Visit      Other   Squamous cell carcinoma ([place])    Stitch removed without complication or sign of infection. Biopsy positive for squamous cell carcinoma. Referral to dermatology placed for additional follow up and treatment.       Relevant Orders   Ambulatory referral to Dermatology    Other Visit Diagnoses   None.      I am having [name] maintain his Multiple Vitamins-Minerals (MULTIVITAMIN PO), fish oil-omega-3 fatty acids, Calcium Carbonate-Vitamin D (CALCIUM + D PO), Nutritional Supplements (JOINT FORMULA PO), amLODipine, losartan, and predniSONE.  Follow-up: Return if symptoms worsen or fail to improve.  Mauricio Po, FNP

## 2016-06-06 ENCOUNTER — Other Ambulatory Visit: Payer: Self-pay | Admitting: Physician Assistant

## 2016-09-16 ENCOUNTER — Ambulatory Visit: Payer: Medicare Other | Admitting: Family Medicine

## 2016-09-20 ENCOUNTER — Other Ambulatory Visit: Payer: Self-pay | Admitting: Family

## 2016-12-28 ENCOUNTER — Other Ambulatory Visit: Payer: Self-pay | Admitting: Family

## 2017-01-06 ENCOUNTER — Telehealth: Payer: Self-pay | Admitting: Family

## 2017-01-06 NOTE — Telephone Encounter (Signed)
Requesting these to be resent to new pharmacy and to have two 90 day refills.    amLODipine (NORVASC) 10 MG  losartan (COZAAR) 100 MG   [facility] [address]

## 2017-01-07 MED ORDER — LOSARTAN POTASSIUM 100 MG PO TABS: ORAL_TABLET | ORAL | 2 refills | Status: DC

## 2017-01-07 MED ORDER — AMLODIPINE BESYLATE 10 MG PO TABS: ORAL_TABLET | ORAL | 2 refills | Status: DC

## 2017-01-07 NOTE — Telephone Encounter (Signed)
Medications have been sent

## 2017-02-26 ENCOUNTER — Encounter: Payer: Self-pay | Admitting: Family

## 2017-02-26 ENCOUNTER — Ambulatory Visit (INDEPENDENT_AMBULATORY_CARE_PROVIDER_SITE_OTHER): Payer: Medicare HMO | Admitting: Family

## 2017-02-26 VITALS — BP 128/78 | HR 92 | Temp 98.2°F | Ht 69.0 in | Wt 154.1 lb

## 2017-02-26 DIAGNOSIS — M62838 Other muscle spasm: Secondary | ICD-10-CM | POA: Diagnosis not present

## 2017-02-26 DIAGNOSIS — H6121 Impacted cerumen, right ear: Secondary | ICD-10-CM | POA: Diagnosis not present

## 2017-02-26 NOTE — Progress Notes (Signed)
Subjective:    Patient ID: Travis Bell, male    DOB: 1946-05-01, 71 y.o.   MRN: [id number]  Chief Complaint  Patient presents with  . Ear Fullness  . Shoulder Pain    HPI:  Travis Bell is a 71 y.o. male who  has a past medical history of Allergic rhinitis; Benign neoplasm of colon; Bronchitis; Colon polyps; Diverticulosis of colon (without mention of hemorrhage); Dysthymic disorder; Headache(784.0); HTN (hypertension); Lentigo; Nonspecific abnormal electrocardiogram (ECG) (EKG); and Sinusitis. and presents today for a    1.) Right shoulder - This is a new problem. Associated symptom of pain located in his right shoulder has been going on for 6 months. Noted it when he started doing more shoulder presses with resistance training. Severity is described as aggravating. Pain is described achy and notes it when exercising. There are no restrictions in range of motion. Denies any attempted treatments or modifying factors that make it better.    2.) Ear Fullness - This is a new problem. Associated symptom of right ear fullness has been going on for several weeks. Denies pain, fever or discharge. Modifying factors include soaking it with peroxide which did not help very much.   No Known Allergies    Outpatient Medications Prior to Visit  Medication Sig Dispense Refill  . amLODipine (NORVASC) 10 MG tablet TAKE 1 TABLET(10 MG) BY MOUTH DAILY 90 tablet 2  . Calcium Carbonate-Vitamin D (CALCIUM + D PO) Take by mouth daily.    . fish oil-omega-3 fatty acids 1000 MG capsule Take 2 g by mouth daily.    Marland Kitchen losartan (COZAAR) 100 MG tablet TAKE 1 TABLET(100 MG) BY MOUTH DAILY 90 tablet 2  . Multiple Vitamins-Minerals (MULTIVITAMIN PO) Take by mouth daily.    . Nutritional Supplements (JOINT FORMULA PO) Take by mouth daily.    . predniSONE (DELTASONE) 50 MG tablet TAKE 1 TABLET(50 MG) BY MOUTH DAILY 5 tablet 0   No facility-administered medications prior to visit.       No past surgical  history on file.    Past Medical History:  Diagnosis Date  . Allergic rhinitis   . Benign neoplasm of colon   . Bronchitis   . Colon polyps   . Diverticulosis of colon (without mention of hemorrhage)   . Dysthymic disorder   . Headache(784.0)   . HTN (hypertension)   . Lentigo   . Nonspecific abnormal electrocardiogram (ECG) (EKG)   . Sinusitis       Review of Systems  Constitutional: Negative for chills and fever.  HENT:       Positive for ear fullness.  Musculoskeletal: Positive for neck stiffness.       Positive for right shoulder pain.       Objective:    BP 128/78 (BP Location: Left Arm, Patient Position: Sitting, Cuff Size: Normal)   Pulse 92   Temp 98.2 F (36.8 C) (Oral)   Ht 5\' 9"  (1.753 m)   Wt 154 lb 1.3 oz (69.9 kg)   SpO2 98%   BMI 22.75 kg/m  Nursing note and vital signs reviewed.  Physical Exam  Constitutional: He is oriented to person, place, and time. He appears well-developed and well-nourished. No distress.  HENT:  Impacted cerumen noted in right ear.   Cardiovascular: Normal rate, regular rhythm, normal heart sounds and intact distal pulses.   Pulmonary/Chest: Effort normal and breath sounds normal.  Musculoskeletal:  Right shoulder/neck - no obvious deformity, discoloration, or edema. There  is palpable tenderness of the upper trapezius with no other tenderness able to be elicited. There is increased muscle tone of the upper trapezius. Shoulder range of motion normal. Neck range of motion restricted and lateral bending and rotation. Distal pulses and sensation are intact and appropriate. Strength is normal. Special tests of the shoulder are unremarkable.  Neurological: He is alert and oriented to person, place, and time.  Skin: Skin is warm and dry.  Psychiatric: He has a normal mood and affect. His behavior is normal. Judgment and thought content normal.   Procedure:  Irrigation of right ear for cerumen removal  Verbal consent was obtained.  The right ear was cleaned out with warm water mixed with Docusate Sodium. There was wax return  and ear drum was visible. Procedure completed without complication and tolerated well.       Assessment & Plan:   Problem List Items Addressed This Visit      Nervous and Auditory   Impacted cerumen of right ear    Right ear successfully lavaged with no remaining cerumen noted following procedure. Prevention information provided and after visit summary as well as post-care instructions. Follow-up if symptoms return.        Other   Spasm of cervical paraspinous muscle - Primary    New-onset pain consistent with spasm of the right upper trapezius limiting range of motion. Recommend ice/moist heat, home exercise therapy, and consider evaluation for osteopathic manual therapy as needed. No radiculopathy. Follow up if symptoms worsen or do not improve.           I am having [name] maintain his Multiple Vitamins-Minerals (MULTIVITAMIN PO), fish oil-omega-3 fatty acids, Calcium Carbonate-Vitamin D (CALCIUM + D PO), Nutritional Supplements (JOINT FORMULA PO), predniSONE, amLODipine, and losartan.   Follow-up: Return if symptoms worsen or fail to improve.  Mauricio Po, FNP

## 2017-02-26 NOTE — Assessment & Plan Note (Signed)
Right ear successfully lavaged with no remaining cerumen noted following procedure. Prevention information provided and after visit summary as well as post-care instructions. Follow-up if symptoms return.

## 2017-02-26 NOTE — Patient Instructions (Signed)
Thank you for choosing [facility].  SUMMARY AND INSTRUCTIONS:  Ice and moist heat x 20 minutes every 2 hours and as needed.  Stretches and exercise throughout the day.  To prevent wax buildup within the ear:   Use equal parts of water and white vinegar  Soak a cotton ball in the solution and place several drops within the ear  Insert cotton ball in external ear canal and let sit for 30 minutes prior to shower  Remove cotton ball and gently irrigate the ear canal in the shower.  Do not irrigate directly into the ear but rather let it hit the external canal and irrigate.  For maintenance, this can be done 1 time weekly.    Follow up:  If your symptoms worsen or fail to improve, please contact our office for further instruction, or in case of emergency go directly to the emergency room at the closest medical facility.    Cervical Strain and Sprain Rehab Ask your health care provider which exercises are safe for you. Do exercises exactly as told by your health care provider and adjust them as directed. It is normal to feel mild stretching, pulling, tightness, or discomfort as you do these exercises, but you should stop right away if you feel sudden pain or your pain gets worse.Do not begin these exercises until told by your health care provider. Stretching and range of motion exercises These exercises warm up your muscles and joints and improve the movement and flexibility of your neck. These exercises also help to relieve pain, numbness, and tingling. Exercise A: Cervical side bend   2. Using good posture, sit on a stable chair or stand up. 3. Without moving your shoulders, slowly tilt your left / right ear to your shoulder until you feel a stretch in your neck muscles. You should be looking straight ahead. 4. Hold for __________ seconds. 5. Repeat with the other side of your neck. Repeat __________ times. Complete this exercise __________ times a day. Exercise B:  Cervical rotation   1. Using good posture, sit on a stable chair or stand up. 2. Slowly turn your head to the side as if you are looking over your left / right shoulder.  Keep your eyes level with the ground.  Stop when you feel a stretch along the side and the back of your neck. 3. Hold for __________ seconds. 4. Repeat this by turning to your other side. Repeat __________ times. Complete this exercise __________ times a day. Exercise C: Thoracic extension and pectoral stretch  2. Roll a towel or a small blanket so it is about 4 inches (10 cm) in diameter. 3. Lie down on your back on a firm surface. 4. Put the towel lengthwise, under your spine in the middle of your back. It should not be not under your shoulder blades. The towel should line up with your spine from your middle back to your lower back. 5. Put your hands behind your head and let your elbows fall out to your sides. 6. Hold for __________ seconds. Repeat __________ times. Complete this exercise __________ times a day. Strengthening exercises These exercises build strength and endurance in your neck. Endurance is the ability to use your muscles for a long time, even after your muscles get tired. Exercise D: Upper cervical flexion, isometric  2. Lie on your back with a thin pillow behind your head and a small rolled-up towel under your neck. 3. Gently tuck your chin toward your chest and nod  your head down to look toward your feet. Do not lift your head off the pillow. 4. Hold for __________ seconds. 5. Release the tension slowly. Relax your neck muscles completely before you repeat this exercise. Repeat __________ times. Complete this exercise __________ times a day. Exercise E: Cervical extension, isometric   3. Stand about 6 inches (15 cm) away from a wall, with your back facing the wall. 4. Place a soft object, about 6-8 inches (15-20 cm) in diameter, between the back of your head and the wall. A soft object could be a  small pillow, a ball, or a folded towel. 5. Gently tilt your head back and press into the soft object. Keep your jaw and forehead relaxed. 6. Hold for __________ seconds. 7. Release the tension slowly. Relax your neck muscles completely before you repeat this exercise. Repeat __________ times. Complete this exercise __________ times a day. Posture and body mechanics   Body mechanics refers to the movements and positions of your body while you do your daily activities. Posture is part of body mechanics. Good posture and healthy body mechanics can help to relieve stress in your body's tissues and joints. Good posture means that your spine is in its natural S-curve position (your spine is neutral), your shoulders are pulled back slightly, and your head is not tipped forward. The following are general guidelines for applying improved posture and body mechanics to your everyday activities. Standing   When standing, keep your spine neutral and keep your feet about hip-width apart. Keep a slight bend in your knees. Your ears, shoulders, and hips should line up.  When you do a task in which you stand in one place for a long time, place one foot up on a stable object that is 2-4 inches (5-10 cm) high, such as a footstool. This helps keep your spine neutral. Sitting    When sitting, keep your spine neutral and your keep feet flat on the floor. Use a footrest, if necessary, and keep your thighs parallel to the floor. Avoid rounding your shoulders, and avoid tilting your head forward.  When working at a desk or a computer, keep your desk at a height where your hands are slightly lower than your elbows. Slide your chair under your desk so you are close enough to maintain good posture.  When working at a computer, place your monitor at a height where you are looking straight ahead and you do not have to tilt your head forward or downward to look at the screen. Resting  When lying down and resting, avoid  positions that are most painful for you. Try to support your neck in a neutral position. You can use a contour pillow or a small rolled-up towel. Your pillow should support your neck but not push on it. This information is not intended to replace advice given to you by your health care provider. Make sure you discuss any questions you have with your health care provider. Document Released: 09/16/2005 Document Revised: 05/23/2016 Document Reviewed: 08/23/2015 Elsevier Interactive Patient Education  2017 [employer].

## 2017-02-26 NOTE — Assessment & Plan Note (Signed)
New-onset pain consistent with spasm of the right upper trapezius limiting range of motion. Recommend ice/moist heat, home exercise therapy, and consider evaluation for osteopathic manual therapy as needed. No radiculopathy. Follow up if symptoms worsen or do not improve.

## 2017-04-03 ENCOUNTER — Telehealth: Payer: Self-pay

## 2017-04-03 NOTE — Telephone Encounter (Signed)
Patient is on the list for [employer] 2018 and may be a good candidate for an AWV. Please let me know if/when appt is scheduled.   

## 2017-04-04 NOTE — Telephone Encounter (Signed)
Called pt to schedule awv. Pt did not answer. Will try to contact pt at a later time.

## 2017-04-21 DIAGNOSIS — R69 Illness, unspecified: Secondary | ICD-10-CM | POA: Diagnosis not present

## 2017-06-23 ENCOUNTER — Other Ambulatory Visit (INDEPENDENT_AMBULATORY_CARE_PROVIDER_SITE_OTHER): Payer: Medicare HMO

## 2017-06-23 ENCOUNTER — Encounter: Payer: Self-pay | Admitting: Family

## 2017-06-23 ENCOUNTER — Ambulatory Visit (INDEPENDENT_AMBULATORY_CARE_PROVIDER_SITE_OTHER): Payer: Medicare HMO | Admitting: Family

## 2017-06-23 VITALS — BP 138/84 | HR 70 | Temp 98.2°F | Resp 16 | Ht 69.0 in | Wt 155.0 lb

## 2017-06-23 DIAGNOSIS — Z23 Encounter for immunization: Secondary | ICD-10-CM | POA: Diagnosis not present

## 2017-06-23 DIAGNOSIS — Z9189 Other specified personal risk factors, not elsewhere classified: Secondary | ICD-10-CM | POA: Diagnosis not present

## 2017-06-23 DIAGNOSIS — Z0000 Encounter for general adult medical examination without abnormal findings: Secondary | ICD-10-CM

## 2017-06-23 DIAGNOSIS — Z125 Encounter for screening for malignant neoplasm of prostate: Secondary | ICD-10-CM

## 2017-06-23 DIAGNOSIS — R9431 Abnormal electrocardiogram [ECG] [EKG]: Secondary | ICD-10-CM

## 2017-06-23 DIAGNOSIS — Z0001 Encounter for general adult medical examination with abnormal findings: Secondary | ICD-10-CM

## 2017-06-23 DIAGNOSIS — E782 Mixed hyperlipidemia: Secondary | ICD-10-CM

## 2017-06-23 DIAGNOSIS — I10 Essential (primary) hypertension: Secondary | ICD-10-CM

## 2017-06-23 DIAGNOSIS — R7301 Impaired fasting glucose: Secondary | ICD-10-CM

## 2017-06-23 LAB — COMPREHENSIVE METABOLIC PANEL
ALT: 17 U/L (ref 0–53)
AST: 22 U/L (ref 0–37)
Albumin: 4.5 g/dL (ref 3.5–5.2)
Alkaline Phosphatase: 46 U/L (ref 39–117)
BUN: 19 mg/dL (ref 6–23)
CHLORIDE: 102 meq/L (ref 96–112)
CO2: 29 mEq/L (ref 19–32)
CREATININE: 0.79 mg/dL (ref 0.40–1.50)
Calcium: 9.7 mg/dL (ref 8.4–10.5)
GFR: 103.39 mL/min (ref >60.00)
GLUCOSE: 118 mg/dL — AB (ref 70–99)
POTASSIUM: 4.1 meq/L (ref 3.5–5.1)
SODIUM: 140 meq/L (ref 135–145)
TOTAL PROTEIN: 6.7 g/dL (ref 6.0–8.3)
Total Bilirubin: 0.8 mg/dL (ref 0.2–1.2)

## 2017-06-23 LAB — LIPID PANEL
Cholesterol: 208 mg/dL — ABNORMAL HIGH (ref 0–200)
HDL: 73.6 mg/dL (ref >39.00)
LDL CALC: 122 mg/dL — AB (ref 0–99)
NONHDL: 134.38
Total CHOL/HDL Ratio: 3
Triglycerides: 63 mg/dL (ref 0.0–149.0)
VLDL: 12.6 mg/dL (ref 0.0–40.0)

## 2017-06-23 LAB — CBC
HEMATOCRIT: 42.7 % (ref 39.0–52.0)
HEMOGLOBIN: 14.6 g/dL (ref 13.0–17.0)
MCHC: 34.1 g/dL (ref 30.0–36.0)
MCV: 92.4 fl (ref 78.0–100.0)
Platelets: 269 10*3/uL (ref 150.0–400.0)
RBC: 4.62 Mil/uL (ref 4.22–5.81)
RDW: 13.2 % (ref 11.5–15.5)
WBC: 5.2 10*3/uL (ref 4.0–10.5)

## 2017-06-23 LAB — PSA: PSA: 2.84 ng/mL (ref 0.10–4.00)

## 2017-06-23 MED ORDER — LOSARTAN POTASSIUM 100 MG PO TABS: ORAL_TABLET | ORAL | 2 refills | Status: DC

## 2017-06-23 MED ORDER — PREDNISONE 50 MG PO TABS: ORAL_TABLET | ORAL | 1 refills | Status: DC

## 2017-06-23 MED ORDER — AMLODIPINE BESYLATE 10 MG PO TABS: ORAL_TABLET | ORAL | 2 refills | Status: DC

## 2017-06-23 NOTE — Assessment & Plan Note (Signed)
Reviewed and updated patient's medical, surgical, family and social history. Medications and allergies were also reviewed. Basic screenings for depression, activities of daily living, hearing, cognition and safety were performed. Provider list was updated and health plan was provided to the patient.  

## 2017-06-23 NOTE — Assessment & Plan Note (Signed)
Stable with fish oil supplementation. Obtain lipid profile. Continue fish oil pending lipid profile results.

## 2017-06-23 NOTE — Progress Notes (Signed)
Subjective:    Patient ID: Travis Bell, male    DOB: Jan 20, 1946, 71 y.o.   MRN: [id number]  Chief Complaint  Patient presents with  . CPE    fasting, wants all rxs printed    HPI:  Travis Bell is a 71 y.o. male who presents today for a Medicare Annual Wellness/Physical exam.    1) Health Maintenance -   Diet - Averages about 5 small meals per day consisting of a regular diet; Caffeine intake of 3-4 cups per day.   Exercise - Runs marathons - most recently completed a marathon in [place].   2) Preventative Exams / Immunizations:  Dental -- Up to date  Vision -- Due for exam    Health Maintenance  Topic Date Due  . INFLUENZA VACCINE  04/30/2017  . COLONOSCOPY  02/28/2018  . TETANUS/TDAP  12/09/2019  . Hepatitis C Screening  Completed  . PNA vac Low Risk Adult  Completed     Immunization History  Administered Date(s) Administered  . Influenza Split 07/31/2012  . Influenza-Unspecified 07/31/2013, 08/30/2014, 09/16/2015  . Pneumococcal Conjugate-13 01/13/2014  . Pneumococcal Polysaccharide-23 04/29/2016  . Td 12/08/2009    RISK FACTORS  Tobacco History  Smoking Status  . Former Smoker  . Packs/day: 0.50  . Years: 15.00  . Types: Cigarettes  . Quit date: 09/30/1977  Smokeless Tobacco  . Never Used     Cardiac risk factors: advanced age (older than 90 for men, 28 for women), dyslipidemia, hypertension and male gender.  Depression Screen  Depression screen [facility] [address] 2/9 06/23/2017  Decreased Interest 0  Down, Depressed, Hopeless 0  PHQ - 2 Score 0     Activities of Daily Living In your present state of health, do you have any difficulty performing the following activities?:  Driving? No Managing money?  No Feeding yourself? No Getting from bed to chair? No Climbing a flight of stairs? No Preparing food and eating?: No Bathing or showering? No Getting dressed: No Getting to the toilet? No Using the toilet: No Moving around from place to place:  No In the past year have you fallen or had a near fall?:No   Home Safety Has smoke detector and wears seat belts. No excess sun exposure. Are there smokers in your home (other than you)?  No Do you feel safe at home?  Yes  Hearing Difficulties: No Do you often ask people to speak up or repeat themselves? No Do you experience ringing or noises in your ears? No  Do you have difficulty understanding soft or whispered voices? No    Cognitive Testing  Alert? Yes   Normal Appearance? Yes  Oriented to person? Yes  Place? Yes   Time? Yes  Recall of three objects?  Yes  Can perform simple calculations? Yes  Displays appropriate judgment? Yes  Can read the correct time from a watch face? Yes  Do you feel that you have a problem with memory? No  Do you often misplace items? No   Advanced Directives have been discussed with the patient? Yes   Current Physicians/Providers and Suppliers  1. Terri Piedra, FNP - Internal Medicine  Indicate any recent Medical Services you may have received from other than [facility] providers in the past year (date may be approximate).  All answers were reviewed with the patient and necessary referrals were made:  Mauricio Po, [place]   06/23/2017    No Known Allergies   Outpatient Medications Prior to Visit  Medication Sig  Dispense Refill  . Calcium Carbonate-Vitamin D (CALCIUM + D PO) Take by mouth daily.    . fish oil-omega-3 fatty acids 1000 MG capsule Take 2 g by mouth daily.    . Multiple Vitamins-Minerals (MULTIVITAMIN PO) Take by mouth daily.    . Nutritional Supplements (JOINT FORMULA PO) Take by mouth daily.    Marland Kitchen amLODipine (NORVASC) 10 MG tablet TAKE 1 TABLET(10 MG) BY MOUTH DAILY 90 tablet 2  . losartan (COZAAR) 100 MG tablet TAKE 1 TABLET(100 MG) BY MOUTH DAILY 90 tablet 2  . predniSONE (DELTASONE) 50 MG tablet TAKE 1 TABLET(50 MG) BY MOUTH DAILY 5 tablet 0   No facility-administered medications prior to visit.      Past Medical History:    Diagnosis Date  . Allergic rhinitis   . Benign neoplasm of colon   . Bronchitis   . Colon polyps   . Diverticulosis of colon (without mention of hemorrhage)   . Dysthymic disorder   . Headache(784.0)   . HTN (hypertension)   . Lentigo   . Nonspecific abnormal electrocardiogram (ECG) (EKG)   . Sinusitis      History reviewed. No pertinent surgical history.   Family History  Problem Relation Age of Onset  . Heart disease Father   . Healthy Sister   . Stroke Mother      Social History   Social History  . Marital status: Married    Spouse name: Travis Bell  . Number of children: 1  . Years of education: 43   Occupational History  . retired     Psychologist, sport and exercise  .  [place] Information   Social History Main Topics  . Smoking status: Former Smoker    Packs/day: 0.50    Years: 15.00    Types: Cigarettes    Quit date: 09/30/1977  . Smokeless tobacco: Never Used  . Alcohol use 0.0 oz/week     Comment: 2 drinks per day  . Drug use: No  . Sexual activity: Not on file   Other Topics Concern  . Not on file   Social History Narrative   Fun: Run, travel   Denies religious beliefs effecting health care.      Review of Systems  Constitutional: Denies fever, chills, fatigue, or significant weight gain/loss. HENT: Head: Denies headache or neck pain Ears: Denies changes in hearing, ringing in ears, earache, drainage Nose: Denies discharge, stuffiness, itching, nosebleed, sinus pain Throat: Denies sore throat, hoarseness, dry mouth, sores, thrush Eyes: Denies loss/changes in vision, pain, redness, blurry/double vision, flashing lights Cardiovascular: Denies chest pain/discomfort, tightness, palpitations, shortness of breath with activity, difficulty lying down, swelling, sudden awakening with shortness of breath Respiratory: Denies shortness of breath, cough, sputum production, wheezing Gastrointestinal: Denies dysphasia, heartburn, change in appetite, nausea, change in bowel  habits, rectal bleeding, constipation, diarrhea, yellow skin or eyes Genitourinary: Denies frequency, urgency, burning/pain, blood in urine, incontinence, change in urinary strength. Musculoskeletal: Denies muscle/joint pain, stiffness, back pain, redness or swelling of joints, trauma Skin: Denies rashes, lumps, itching, dryness, color changes, or hair/nail changes Neurological: Denies dizziness, fainting, seizures, weakness, numbness, tingling, tremor Psychiatric - Denies nervousness, stress, depression or memory loss Endocrine: Denies heat or cold intolerance, sweating, frequent urination, excessive thirst, changes in appetite Hematologic: Denies ease of bruising or bleeding    Objective:     BP 138/84 (BP Location: Left Arm, Patient Position: Sitting, Cuff Size: Normal)   Pulse 70   Temp 98.2 F (36.8 C) (Oral)   Resp 16  Ht 5\' 9"  (1.753 m)   Wt 155 lb (70.3 kg)   SpO2 98%   BMI 22.89 kg/m  Nursing note and vital signs reviewed.  Physical Exam  Constitutional: He is oriented to person, place, and time. He appears well-developed and well-nourished.  HENT:  Head: Normocephalic.  Right Ear: Hearing, tympanic membrane, external ear and ear canal normal.  Left Ear: Hearing, tympanic membrane, external ear and ear canal normal.  Nose: Nose normal.  Mouth/Throat: Uvula is midline, oropharynx is clear and moist and mucous membranes are normal.  Eyes: Pupils are equal, round, and reactive to light. Conjunctivae and EOM are normal.  Neck: Neck supple. No JVD present. No tracheal deviation present. No thyromegaly present.  Cardiovascular: Normal rate, regular rhythm, normal heart sounds and intact distal pulses.   Pulmonary/Chest: Effort normal and breath sounds normal.  Abdominal: Soft. Bowel sounds are normal. He exhibits no distension and no mass. There is no tenderness. There is no rebound and no guarding.  Musculoskeletal: Normal range of motion. He exhibits no edema or tenderness.   Lymphadenopathy:    He has no cervical adenopathy.  Neurological: He is alert and oriented to person, place, and time. He has normal reflexes. No cranial nerve deficit. He exhibits normal muscle tone. Coordination normal.  Skin: Skin is warm and dry.  Psychiatric: He has a normal mood and affect. His behavior is normal. Judgment and thought content normal.       Assessment & Plan:   During the course of the visit the patient was educated and counseled about appropriate screening and preventive services including:    Pneumococcal vaccine   Influenza vaccine  Screening electrocardiogram  Prostate cancer screening  Colorectal cancer screening  Diabetes screening  Glaucoma screening  Nutrition counseling   Diet review for nutrition referral? Yes ____  Not Indicated _X___   Patient Instructions (the written plan) was given to the patient.  Medicare Attestation I have personally reviewed: The patient's medical and social history Their use of alcohol, tobacco or illicit drugs Their current medications and supplements The patient's functional ability including ADLs,fall risks, home safety risks, cognitive, and hearing and visual impairment Diet and physical activities Evidence for depression or mood disorders  The patient's weight, height, BMI,  have been recorded in the chart.  I have made referrals, counseling, and provided education to the patient based on review of the above and I have provided the patient with a written personalized care plan for preventive services.     Problem List Items Addressed This Visit      Cardiovascular and Mediastinum   HYPERTENSION, BENIGN ESSENTIAL    Blood pressure well controlled and below goal of 140/90 with current medication regimen. Continue current dosage of losartan and amlodipine. Encouraged to continue to monitor blood pressure at home and follow a low sodium diet.       Relevant Medications   amLODipine (NORVASC) 10 MG  tablet   losartan (COZAAR) 100 MG tablet   Other Relevant Orders   EKG 12-Lead (Completed)     Other   Hyperlipidemia    Stable with fish oil supplementation. Obtain lipid profile. Continue fish oil pending lipid profile results.       Relevant Medications   amLODipine (NORVASC) 10 MG tablet   losartan (COZAAR) 100 MG tablet   ABNORMAL ELECTROCARDIOGRAM    Previously noted to have an abnormal EKG. In office EKG obtain today and compared to previous EKG with no significant changes showing sinus rhythm  and left ventricular hypertrophy. No cardiac symptoms. Continue to monitor.       Encounter for general adult medical examination with abnormal findings   Relevant Orders   CBC   Comprehensive metabolic panel   Lipid panel   PSA   Medicare annual wellness visit, subsequent - Primary    Reviewed and updated patient's medical, surgical, family and social history. Medications and allergies were also reviewed. Basic screenings for depression, activities of daily living, hearing, cognition and safety were performed. Provider list was updated and health plan was provided to the patient.        Other Visit Diagnoses    At risk for colon cancer       Relevant Medications   predniSONE (DELTASONE) 50 MG tablet   Other Relevant Orders   Ambulatory referral to Gastroenterology       I am having [name] maintain his Multiple Vitamins-Minerals (MULTIVITAMIN PO), fish oil-omega-3 fatty acids, Calcium Carbonate-Vitamin D (CALCIUM + D PO), Nutritional Supplements (JOINT FORMULA PO), amLODipine, losartan, and predniSONE.   Meds ordered this encounter  Medications  . amLODipine (NORVASC) 10 MG tablet    Sig: TAKE 1 TABLET(10 MG) BY MOUTH DAILY    Dispense:  90 tablet    Refill:  2  . losartan (COZAAR) 100 MG tablet    Sig: TAKE 1 TABLET(100 MG) BY MOUTH DAILY    Dispense:  90 tablet    Refill:  2  . predniSONE (DELTASONE) 50 MG tablet    Sig: TAKE 1 TABLET(50 MG) BY MOUTH DAILY     Dispense:  5 tablet    Refill:  1     Follow-up: Return in about 1 year (around 06/23/2018), or if symptoms worsen or fail to improve.   Mauricio Po, FNP

## 2017-06-23 NOTE — Assessment & Plan Note (Signed)
Blood pressure well controlled and below goal of 140/90 with current medication regimen. Continue current dosage of losartan and amlodipine. Encouraged to continue to monitor blood pressure at home and follow a low sodium diet.

## 2017-06-23 NOTE — Patient Instructions (Signed)
Thank you for choosing [facility].  SUMMARY AND INSTRUCTIONS:  Please continue to take your medications as prescribed.  We will check your blood work today.  Sandy!  Follow up in 1 year or sooner if needed.   Medication:  Your prescription(s) have been submitted to your pharmacy or been printed and provided for you. Please take as directed and contact our office if you believe you are having problem(s) with the medication(s) or have any questions.  Labs:  Please stop by the lab on the lower level of the building for your blood work. Your results will be released to [place] (or called to you) after review, usually within 72 hours after test completion. If any changes need to be made, you will be notified at that same time.  1.) The lab is open from 7:30am to 5:30 pm Monday-Friday 2.) No appointment is necessary 3.) Fasting (if needed) is 6-8 hours after food and drink; black coffee and water are okay   Follow up:  If your symptoms worsen or fail to improve, please contact our office for further instruction, or in case of emergency go directly to the emergency room at the closest medical facility.    Health Maintenance, Male A healthy lifestyle and preventive care is important for your health and wellness. Ask your health care provider about what schedule of regular examinations is right for you. What should I know about weight and diet? Eat a Healthy Diet  Eat plenty of vegetables, fruits, whole grains, low-fat dairy products, and lean protein.  Do not eat a lot of foods high in solid fats, added sugars, or salt.  Maintain a Healthy Weight Regular exercise can help you achieve or maintain a healthy weight. You should:  Do at least 150 minutes of exercise each week. The exercise should increase your heart rate and make you sweat (moderate-intensity exercise).  Do strength-training exercises at least twice a week.  Watch Your Levels of Cholesterol and Blood  Lipids  Have your blood tested for lipids and cholesterol every 5 years starting at 69 years of age. If you are at high risk for heart disease, you should start having your blood tested when you are 69 years old. You may need to have your cholesterol levels checked more often if: ? Your lipid or cholesterol levels are high. ? You are older than 69 years of age. ? You are at high risk for heart disease.  What should I know about cancer screening? Many types of cancers can be detected early and may often be prevented. Lung Cancer  You should be screened every year for lung cancer if: ? You are a current smoker who has smoked for at least 30 years. ? You are a former smoker who has quit within the past 15 years.  Talk to your health care provider about your screening options, when you should start screening, and how often you should be screened.  Colorectal Cancer  Routine colorectal cancer screening usually begins at 69 years of age and should be repeated every 5-10 years until you are 69 years old. You may need to be screened more often if early forms of precancerous polyps or small growths are found. Your health care provider may recommend screening at an earlier age if you have risk factors for colon cancer.  Your health care provider may recommend using home test kits to check for hidden blood in the stool.  A small camera at the end of a tube can  be used to examine your colon (sigmoidoscopy or colonoscopy). This checks for the earliest forms of colorectal cancer.  Prostate and Testicular Cancer  Depending on your age and overall health, your health care provider may do certain tests to screen for prostate and testicular cancer.  Talk to your health care provider about any symptoms or concerns you have about testicular or prostate cancer.  Skin Cancer  Check your skin from head to toe regularly.  Tell your health care provider about any new moles or changes in moles, especially  if: ? There is a change in a mole's size, shape, or color. ? You have a mole that is larger than a pencil eraser.  Always use sunscreen. Apply sunscreen liberally and repeat throughout the day.  Protect yourself by wearing long sleeves, pants, a wide-brimmed hat, and sunglasses when outside.  What should I know about heart disease, diabetes, and high blood pressure?  If you are 74-68 years of age, have your blood pressure checked every 3-5 years. If you are 39 years of age or older, have your blood pressure checked every year. You should have your blood pressure measured twice-once when you are at a hospital or clinic, and once when you are not at a hospital or clinic. Record the average of the two measurements. To check your blood pressure when you are not at a hospital or clinic, you can use: ? An automated blood pressure machine at a pharmacy. ? A home blood pressure monitor.  Talk to your health care provider about your target blood pressure.  If you are between 51-37 years old, ask your health care provider if you should take aspirin to prevent heart disease.  Have regular diabetes screenings by checking your fasting blood sugar level. ? If you are at a normal weight and have a low risk for diabetes, have this test once every three years after the age of 72. ? If you are overweight and have a high risk for diabetes, consider being tested at a younger age or more often.  A one-time screening for abdominal aortic aneurysm (AAA) by ultrasound is recommended for men aged 66-75 years who are current or former smokers. What should I know about preventing infection? Hepatitis B If you have a higher risk for hepatitis B, you should be screened for this virus. Talk with your health care provider to find out if you are at risk for hepatitis B infection. Hepatitis C Blood testing is recommended for:  Everyone born from [birth year] through 1963.  Anyone with known risk factors for hepatitis  C.  Sexually Transmitted Diseases (STDs)  You should be screened each year for STDs including gonorrhea and chlamydia if: ? You are sexually active and are younger than 69 years of age. ? You are older than 69 years of age and your health care provider tells you that you are at risk for this type of infection. ? Your sexual activity has changed since you were last screened and you are at an increased risk for chlamydia or gonorrhea. Ask your health care provider if you are at risk.  Talk with your health care provider about whether you are at high risk of being infected with HIV. Your health care provider may recommend a prescription medicine to help prevent HIV infection.  What else can I do?  Schedule regular health, dental, and eye exams.  Stay current with your vaccines (immunizations).  Do not use any tobacco products, such as cigarettes, chewing tobacco, and e-cigarettes.  If you need help quitting, ask your health care provider.  Limit alcohol intake to no more than 2 drinks per day. One drink equals 12 ounces of beer, 5 ounces of wine, or 1 ounces of hard liquor.  Do not use street drugs.  Do not share needles.  Ask your health care provider for help if you need support or information about quitting drugs.  Tell your health care provider if you often feel depressed.  Tell your health care provider if you have ever been abused or do not feel safe at home. This information is not intended to replace advice given to you by your health care provider. Make sure you discuss any questions you have with your health care provider. Document Released: 03/14/2008 Document Revised: 05/15/2016 Document Reviewed: 06/20/2015 Elsevier Interactive Patient Education  [employer].

## 2017-06-23 NOTE — Assessment & Plan Note (Signed)
Previously noted to have an abnormal EKG. In office EKG obtain today and compared to previous EKG with no significant changes showing sinus rhythm and left ventricular hypertrophy. No cardiac symptoms. Continue to monitor.

## 2017-06-30 ENCOUNTER — Other Ambulatory Visit (INDEPENDENT_AMBULATORY_CARE_PROVIDER_SITE_OTHER): Payer: Medicare HMO

## 2017-06-30 DIAGNOSIS — R7301 Impaired fasting glucose: Secondary | ICD-10-CM | POA: Diagnosis not present

## 2017-06-30 LAB — HEMOGLOBIN A1C: Hgb A1c MFr Bld: 5.7 % (ref 4.6–6.5)

## 2017-07-06 ENCOUNTER — Encounter: Payer: Self-pay | Admitting: Family

## 2018-01-22 DIAGNOSIS — R69 Illness, unspecified: Secondary | ICD-10-CM | POA: Diagnosis not present

## 2018-04-24 ENCOUNTER — Other Ambulatory Visit: Payer: Self-pay | Admitting: Family

## 2018-05-06 ENCOUNTER — Telehealth: Payer: Self-pay | Admitting: Nurse Practitioner

## 2018-05-06 MED ORDER — AMLODIPINE BESYLATE 10 MG PO TABS: ORAL_TABLET | ORAL | 0 refills | Status: DC

## 2018-05-06 MED ORDER — LOSARTAN POTASSIUM 100 MG PO TABS: ORAL_TABLET | ORAL | 0 refills | Status: DC

## 2018-05-06 NOTE — Telephone Encounter (Signed)
Patient checking status, please advise Copied from [place] [phone]. Topic: Quick Communication - See Telephone Encounter >> May 05, 2018  8:03 AM Hewitt Shorts wrote: Pt is needing a refill on losartan and amlodipine -he states that he needs a new provider but he is leaving to go out of town and is needing his medication and he has schedule a CPE with [facility]

## 2018-05-06 NOTE — Telephone Encounter (Signed)
Per office policy sent 30 day to local pharmacy until appt.../lmb  

## 2018-06-12 DIAGNOSIS — M79604 Pain in right leg: Secondary | ICD-10-CM | POA: Diagnosis not present

## 2018-06-12 DIAGNOSIS — S76011D Strain of muscle, fascia and tendon of right hip, subsequent encounter: Secondary | ICD-10-CM | POA: Diagnosis not present

## 2018-06-17 DIAGNOSIS — S76011D Strain of muscle, fascia and tendon of right hip, subsequent encounter: Secondary | ICD-10-CM | POA: Diagnosis not present

## 2018-06-17 DIAGNOSIS — M79604 Pain in right leg: Secondary | ICD-10-CM | POA: Diagnosis not present

## 2018-06-23 DIAGNOSIS — S76011D Strain of muscle, fascia and tendon of right hip, subsequent encounter: Secondary | ICD-10-CM | POA: Diagnosis not present

## 2018-06-23 DIAGNOSIS — M79604 Pain in right leg: Secondary | ICD-10-CM | POA: Diagnosis not present

## 2018-07-13 ENCOUNTER — Encounter: Payer: Self-pay | Admitting: Nurse Practitioner

## 2018-07-13 ENCOUNTER — Other Ambulatory Visit (INDEPENDENT_AMBULATORY_CARE_PROVIDER_SITE_OTHER): Payer: Medicare HMO

## 2018-07-13 ENCOUNTER — Ambulatory Visit (INDEPENDENT_AMBULATORY_CARE_PROVIDER_SITE_OTHER): Payer: Medicare HMO | Admitting: Nurse Practitioner

## 2018-07-13 VITALS — BP 112/80 | HR 78 | Ht 69.0 in | Wt 159.0 lb

## 2018-07-13 DIAGNOSIS — R7303 Prediabetes: Secondary | ICD-10-CM | POA: Diagnosis not present

## 2018-07-13 DIAGNOSIS — Z23 Encounter for immunization: Secondary | ICD-10-CM | POA: Diagnosis not present

## 2018-07-13 DIAGNOSIS — Z125 Encounter for screening for malignant neoplasm of prostate: Secondary | ICD-10-CM | POA: Diagnosis not present

## 2018-07-13 DIAGNOSIS — Z0000 Encounter for general adult medical examination without abnormal findings: Secondary | ICD-10-CM

## 2018-07-13 DIAGNOSIS — I10 Essential (primary) hypertension: Secondary | ICD-10-CM | POA: Diagnosis not present

## 2018-07-13 DIAGNOSIS — Z13220 Encounter for screening for lipoid disorders: Secondary | ICD-10-CM

## 2018-07-13 LAB — COMPREHENSIVE METABOLIC PANEL
ALK PHOS: 50 U/L (ref 39–117)
ALT: 22 U/L (ref 0–53)
AST: 19 U/L (ref 0–37)
Albumin: 4.6 g/dL (ref 3.5–5.2)
BUN: 25 mg/dL — AB (ref 6–23)
CHLORIDE: 101 meq/L (ref 96–112)
CO2: 28 mEq/L (ref 19–32)
Calcium: 9.7 mg/dL (ref 8.4–10.5)
Creatinine, Ser: 0.88 mg/dL (ref 0.40–1.50)
GFR: 91.01 mL/min (ref >60.00)
GLUCOSE: 124 mg/dL — AB (ref 70–99)
POTASSIUM: 4 meq/L (ref 3.5–5.1)
SODIUM: 137 meq/L (ref 135–145)
Total Bilirubin: 0.9 mg/dL (ref 0.2–1.2)
Total Protein: 6.8 g/dL (ref 6.0–8.3)

## 2018-07-13 LAB — LIPID PANEL
Cholesterol: 192 mg/dL (ref 0–200)
HDL: 68.1 mg/dL (ref >39.00)
LDL CALC: 109 mg/dL — AB (ref 0–99)
NONHDL: 123.42
Total CHOL/HDL Ratio: 3
Triglycerides: 72 mg/dL (ref 0.0–149.0)
VLDL: 14.4 mg/dL (ref 0.0–40.0)

## 2018-07-13 LAB — CBC
HEMATOCRIT: 43.3 % (ref 39.0–52.0)
HEMOGLOBIN: 14.6 g/dL (ref 13.0–17.0)
MCHC: 33.8 g/dL (ref 30.0–36.0)
MCV: 91.2 fl (ref 78.0–100.0)
Platelets: 270 10*3/uL (ref 150.0–400.0)
RBC: 4.74 Mil/uL (ref 4.22–5.81)
RDW: 13.4 % (ref 11.5–15.5)
WBC: 5.7 10*3/uL (ref 4.0–10.5)

## 2018-07-13 LAB — HEMOGLOBIN A1C: Hgb A1c MFr Bld: 5.7 % (ref 4.6–6.5)

## 2018-07-13 LAB — PSA: PSA: 2.97 ng/mL (ref 0.10–4.00)

## 2018-07-13 MED ORDER — AMLODIPINE BESYLATE 10 MG PO TABS: ORAL_TABLET | ORAL | 3 refills | Status: DC

## 2018-07-13 MED ORDER — LOSARTAN POTASSIUM 100 MG PO TABS: ORAL_TABLET | ORAL | 3 refills | Status: DC

## 2018-07-13 NOTE — Assessment & Plan Note (Signed)
Stable Continue current medications - Lipid panel; Future - CBC; Future - Comprehensive metabolic panel; Future - losartan (COZAAR) 100 MG tablet; TAKE 1 TABLET(100 MG) BY MOUTH DAILY  Dispense: 90 tablet; Refill: 3 - amLODipine (NORVASC) 10 MG tablet; TAKE 1 TABLET(10 MG) BY MOUTH DAILY  Dispense: 90 tablet; Refill: 3

## 2018-07-13 NOTE — Assessment & Plan Note (Signed)
Routine general medical examination at a health care facility Reviewed annual screening exams, healthy lifestyle, additional information provided on AVS - Hemoglobin A1c; Future - Lipid panel; Future - PSA; Future - CBC; Future - Comprehensive metabolic panel; Future  Screening for cholesterol level Fasting today - Lipid panel; Future  Screening for prostate cancer- PSA; Future  Pre-diabetes- Hemoglobin A1c; Future  Need for influenza vaccination- Flu vaccine HIGH DOSE PF

## 2018-07-13 NOTE — Patient Instructions (Signed)
Please head downstairs for lab work  It was nice to meet you today   Health Maintenance, Male A healthy lifestyle and preventive care is important for your health and wellness. Ask your health care provider about what schedule of regular examinations is right for you. What should I know about weight and diet? Eat a Healthy Diet  Eat plenty of vegetables, fruits, whole grains, low-fat dairy products, and lean protein.  Do not eat a lot of foods high in solid fats, added sugars, or salt.  Maintain a Healthy Weight Regular exercise can help you achieve or maintain a healthy weight. You should:  Do at least 150 minutes of exercise each week. The exercise should increase your heart rate and make you sweat (moderate-intensity exercise).  Do strength-training exercises at least twice a week.  Watch Your Levels of Cholesterol and Blood Lipids  Have your blood tested for lipids and cholesterol every 5 years starting at 70 years of age. If you are at high risk for heart disease, you should start having your blood tested when you are 70 years old. You may need to have your cholesterol levels checked more often if: ? Your lipid or cholesterol levels are high. ? You are older than 70 years of age. ? You are at high risk for heart disease.  What should I know about cancer screening? Many types of cancers can be detected early and may often be prevented. Lung Cancer  You should be screened every year for lung cancer if: ? You are a current smoker who has smoked for at least 30 years. ? You are a former smoker who has quit within the past 15 years.  Talk to your health care provider about your screening options, when you should start screening, and how often you should be screened.  Colorectal Cancer  Routine colorectal cancer screening usually begins at 70 years of age and should be repeated every 5-10 years until you are 70 years old. You may need to be screened more often if early forms of  precancerous polyps or small growths are found. Your health care provider may recommend screening at an earlier age if you have risk factors for colon cancer.  Your health care provider may recommend using home test kits to check for hidden blood in the stool.  A small camera at the end of a tube can be used to examine your colon (sigmoidoscopy or colonoscopy). This checks for the earliest forms of colorectal cancer.  Prostate and Testicular Cancer  Depending on your age and overall health, your health care provider may do certain tests to screen for prostate and testicular cancer.  Talk to your health care provider about any symptoms or concerns you have about testicular or prostate cancer.  Skin Cancer  Check your skin from head to toe regularly.  Tell your health care provider about any new moles or changes in moles, especially if: ? There is a change in a mole's size, shape, or color. ? You have a mole that is larger than a pencil eraser.  Always use sunscreen. Apply sunscreen liberally and repeat throughout the day.  Protect yourself by wearing long sleeves, pants, a wide-brimmed hat, and sunglasses when outside.  What should I know about heart disease, diabetes, and high blood pressure?  If you are 93-93 years of age, have your blood pressure checked every 3-5 years. If you are 54 years of age or older, have your blood pressure checked every year. You should have  your blood pressure measured twice-once when you are at a hospital or clinic, and once when you are not at a hospital or clinic. Record the average of the two measurements. To check your blood pressure when you are not at a hospital or clinic, you can use: ? An automated blood pressure machine at a pharmacy. ? A home blood pressure monitor.  Talk to your health care provider about your target blood pressure.  If you are between 45-79 years old, ask your health care provider if you should take aspirin to prevent heart  disease.  Have regular diabetes screenings by checking your fasting blood sugar level. ? If you are at a normal weight and have a low risk for diabetes, have this test once every three years after the age of 45. ? If you are overweight and have a high risk for diabetes, consider being tested at a younger age or more often.  A one-time screening for abdominal aortic aneurysm (AAA) by ultrasound is recommended for men aged 65-75 years who are current or former smokers. What should I know about preventing infection? Hepatitis B If you have a higher risk for hepatitis B, you should be screened for this virus. Talk with your health care provider to find out if you are at risk for hepatitis B infection. Hepatitis C Blood testing is recommended for:  Everyone born from 1945 through 1965.  Anyone with known risk factors for hepatitis C.  Sexually Transmitted Diseases (STDs)  You should be screened each year for STDs including gonorrhea and chlamydia if: ? You are sexually active and are younger than 70 years of age. ? You are older than 70 years of age and your health care provider tells you that you are at risk for this type of infection. ? Your sexual activity has changed since you were last screened and you are at an increased risk for chlamydia or gonorrhea. Ask your health care provider if you are at risk.  Talk with your health care provider about whether you are at high risk of being infected with HIV. Your health care provider may recommend a prescription medicine to help prevent HIV infection.  What else can I do?  Schedule regular health, dental, and eye exams.  Stay current with your vaccines (immunizations).  Do not use any tobacco products, such as cigarettes, chewing tobacco, and e-cigarettes. If you need help quitting, ask your health care provider.  Limit alcohol intake to no more than 2 drinks per day. One drink equals 12 ounces of beer, 5 ounces of wine, or 1 ounces of  hard liquor.  Do not use street drugs.  Do not share needles.  Ask your health care provider for help if you need support or information about quitting drugs.  Tell your health care provider if you often feel depressed.  Tell your health care provider if you have ever been abused or do not feel safe at home. This information is not intended to replace advice given to you by your health care provider. Make sure you discuss any questions you have with your health care provider. Document Released: 03/14/2008 Document Revised: 05/15/2016 Document Reviewed: 06/20/2015 Elsevier Interactive Patient Education  2018 [employer]  

## 2018-07-13 NOTE — Progress Notes (Signed)
Travis Bell is a 72 y.o. male with the following history as recorded in [employer]:  Patient Active Problem List   Diagnosis Date Noted  . Spasm of cervical paraspinous muscle 02/26/2017  . Squamous cell carcinoma 04/29/2016  . Benign skin lesion of thigh 04/22/2016  . Hip flexor tendon tightness 06/22/2015  . Encounter for general adult medical examination with abnormal findings 04/06/2015  . Medicare annual wellness visit, subsequent 04/06/2015  . Anemia 04/07/2013  . Back strain 12/02/2012  . Impacted cerumen of right ear 03/06/2012  . Physical exam, annual 12/28/2010  . HYPERTENSION, BENIGN ESSENTIAL 09/06/2010  . COLONIC POLYPS 04/28/2008  . Hyperlipidemia 04/28/2008  . ALLERGIC RHINITIS 04/28/2008  . DIVERTICULOSIS OF COLON 04/28/2008  . LENTIGO 04/28/2008  . ABNORMAL ELECTROCARDIOGRAM 04/28/2008  . ANXIETY DEPRESSION 04/27/2008  . SINUSITIS 04/27/2008  . BRONCHITIS 04/27/2008  . HEADACHE 04/27/2008    Current Outpatient Medications  Medication Sig Dispense Refill  . amLODipine (NORVASC) 10 MG tablet TAKE 1 TABLET(10 MG) BY MOUTH DAILY 90 tablet 3  . Calcium Carbonate-Vitamin D (CALCIUM + D PO) Take by mouth daily.    . fish oil-omega-3 fatty acids 1000 MG capsule Take 2 g by mouth daily.    Marland Kitchen losartan (COZAAR) 100 MG tablet TAKE 1 TABLET(100 MG) BY MOUTH DAILY 90 tablet 3  . Multiple Vitamins-Minerals (MULTIVITAMIN PO) Take by mouth daily.    . Nutritional Supplements (JOINT FORMULA PO) Take by mouth daily.     No current facility-administered medications for this visit.     Allergies: Patient has no known allergies.  Past Medical History:  Diagnosis Date  . Allergic rhinitis   . Benign neoplasm of colon   . Bronchitis   . Colon polyps   . Diverticulosis of colon (without mention of hemorrhage)   . Dysthymic disorder   . Headache(784.0)   . HTN (hypertension)   . Lentigo   . Nonspecific abnormal electrocardiogram (ECG) (EKG)   . Sinusitis     History  reviewed. No pertinent surgical history.  Family History  Problem Relation Age of Onset  . Heart disease Father   . Healthy Sister   . Stroke Mother     Social History   Tobacco Use  . Smoking status: Former Smoker    Packs/day: 0.50    Years: 15.00    Pack years: 7.50    Types: Cigarettes    Last attempt to quit: 09/30/1977    Years since quitting: 40.8  . Smokeless tobacco: Never Used  Substance Use Topics  . Alcohol use: Yes    Comment: 2 drinks per day     Subjective:  Travis Bell is here today to establish care as a transfer from another provider in the same practice. 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  BP Readings from Last 3 Encounters:  07/13/18 112/80  06/23/17 138/84  02/26/17 128/78  Review of Systems  Constitutional: Negative for chills and fever.  HENT: Negative for hearing loss and sore throat.   Eyes: Negative for blurred vision and double vision.  Respiratory: Negative for cough and shortness of breath.   Cardiovascular: Negative for chest pain and palpitations.  Gastrointestinal: Negative for abdominal pain, constipation, diarrhea and heartburn.  Genitourinary: Negative for dysuria and hematuria.  Musculoskeletal: Negative for falls, joint pain and myalgias.  Skin: Negative for rash.  Neurological: Negative for speech  change, loss of consciousness and headaches.  Endo/Heme/Allergies: Negative for environmental allergies. Does not bruise/bleed easily.  Psychiatric/Behavioral: Negative for depression. The patient is not nervous/anxious.     Objective:  Vitals:   07/13/18 0927  BP: 112/80  Pulse: 78  SpO2: 99%  Weight: 159 lb (72.1 kg)  Height: 5\' 9"  (1.753 m)    General: Well developed, well nourished, in no acute distress  Skin : Warm and dry.  Head: Normocephalic and atraumatic  Eyes: Sclera and conjunctiva clear; pupils round and reactive to light; extraocular movements intact  Ears: External normal; canals clear; tympanic membranes normal  Oropharynx: Pink, supple. No suspicious lesions  Neck: Supple without thyromegaly, adenopathy  Lungs: Respirations unlabored; clear to auscultation bilaterally without wheeze, rales, rhonchi  CVS exam: normal rate, regular rhythm, normal S1, S2, no murmurs, rubs, clicks or gallops.  Abdomen: Soft; nontender; nondistended; normoactive bowel sounds; no masses or hepatosplenomegaly  Musculoskeletal: No deformities; no active joint inflammation  Extremities: No edema, cyanosis Vessels: Symmetric bilaterally  Neurologic: Alert and oriented; speech intact; face symmetrical; moves all extremities well; CNII-XII intact without focal deficit   Physical Exam   Assessment:  1. Routine general medical examination at a health care facility   2. Need for influenza vaccination   3. HYPERTENSION, BENIGN ESSENTIAL   4. Screening for cholesterol level   5. Screening for prostate cancer   6. Pre-diabetes     Plan:   Return in about 1 year (around 07/14/2019) for CPE.  Orders Placed This Encounter  Procedures  . Flu vaccine HIGH DOSE PF  . Hemoglobin A1c    Standing Status:   Future    Standing Expiration Date:   07/14/2019  . Lipid panel    Standing Status:   Future    Standing Expiration Date:   07/14/2019  . PSA    Standing Status:   Future    Standing  Expiration Date:   07/14/2019  . CBC    Standing Status:   Future    Standing Expiration Date:   07/14/2019  . Comprehensive metabolic panel    Standing Status:   Future    Standing Expiration Date:   07/14/2019    Requested Prescriptions   Signed Prescriptions Disp Refills  . losartan (COZAAR) 100 MG tablet 90 tablet 3    Sig: TAKE 1 TABLET(100 MG) BY MOUTH DAILY  . amLODipine (NORVASC) 10 MG tablet 90 tablet 3    Sig: TAKE 1 TABLET(10 MG) BY MOUTH DAILY

## 2018-08-03 ENCOUNTER — Encounter: Payer: Self-pay | Admitting: *Deleted

## 2019-08-03 ENCOUNTER — Other Ambulatory Visit (INDEPENDENT_AMBULATORY_CARE_PROVIDER_SITE_OTHER): Payer: Medicare HMO

## 2019-08-03 ENCOUNTER — Encounter: Payer: Self-pay | Admitting: Family

## 2019-08-03 ENCOUNTER — Other Ambulatory Visit: Payer: Self-pay

## 2019-08-03 ENCOUNTER — Ambulatory Visit (INDEPENDENT_AMBULATORY_CARE_PROVIDER_SITE_OTHER): Payer: Medicare HMO | Admitting: Family

## 2019-08-03 VITALS — BP 124/76 | HR 59 | Temp 98.0°F | Ht 69.0 in | Wt 151.2 lb

## 2019-08-03 DIAGNOSIS — Z13220 Encounter for screening for lipoid disorders: Secondary | ICD-10-CM

## 2019-08-03 DIAGNOSIS — E559 Vitamin D deficiency, unspecified: Secondary | ICD-10-CM

## 2019-08-03 DIAGNOSIS — Z0000 Encounter for general adult medical examination without abnormal findings: Secondary | ICD-10-CM

## 2019-08-03 DIAGNOSIS — Z125 Encounter for screening for malignant neoplasm of prostate: Secondary | ICD-10-CM

## 2019-08-03 DIAGNOSIS — Z23 Encounter for immunization: Secondary | ICD-10-CM

## 2019-08-03 DIAGNOSIS — I10 Essential (primary) hypertension: Secondary | ICD-10-CM | POA: Diagnosis not present

## 2019-08-03 LAB — CBC WITH DIFFERENTIAL/PLATELET
Basophils Absolute: 0 10*3/uL (ref 0.0–0.1)
Basophils Relative: 0.8 % (ref 0.0–3.0)
Eosinophils Absolute: 0.1 10*3/uL (ref 0.0–0.7)
Eosinophils Relative: 1.9 % (ref 0.0–5.0)
HCT: 40.4 % (ref 39.0–52.0)
Hemoglobin: 13.8 g/dL (ref 13.0–17.0)
Lymphocytes Relative: 38.7 % (ref 12.0–46.0)
Lymphs Abs: 1.8 10*3/uL (ref 0.7–4.0)
MCHC: 34 g/dL (ref 30.0–36.0)
MCV: 93.2 fl (ref 78.0–100.0)
Monocytes Absolute: 0.4 10*3/uL (ref 0.1–1.0)
Monocytes Relative: 8.9 % (ref 3.0–12.0)
Neutro Abs: 2.3 10*3/uL (ref 1.4–7.7)
Neutrophils Relative %: 49.7 % (ref 43.0–77.0)
Platelets: 267 10*3/uL (ref 150.0–400.0)
RBC: 4.34 Mil/uL (ref 4.22–5.81)
RDW: 13.1 % (ref 11.5–15.5)
WBC: 4.6 10*3/uL (ref 4.0–10.5)

## 2019-08-03 LAB — COMPREHENSIVE METABOLIC PANEL
ALT: 17 U/L (ref 0–53)
AST: 23 U/L (ref 0–37)
Albumin: 4.8 g/dL (ref 3.5–5.2)
Alkaline Phosphatase: 50 U/L (ref 39–117)
BUN: 18 mg/dL (ref 6–23)
CO2: 28 mEq/L (ref 19–32)
Calcium: 9.7 mg/dL (ref 8.4–10.5)
Chloride: 99 mEq/L (ref 96–112)
Creatinine, Ser: 0.84 mg/dL (ref 0.40–1.50)
GFR: 90.07 mL/min (ref >60.00)
Glucose, Bld: 117 mg/dL — ABNORMAL HIGH (ref 70–99)
Potassium: 4 mEq/L (ref 3.5–5.1)
Sodium: 136 mEq/L (ref 135–145)
Total Bilirubin: 0.8 mg/dL (ref 0.2–1.2)
Total Protein: 6.8 g/dL (ref 6.0–8.3)

## 2019-08-03 LAB — TSH: TSH: 1.55 u[IU]/mL (ref 0.35–4.50)

## 2019-08-03 LAB — LIPID PANEL
Cholesterol: 193 mg/dL (ref 0–200)
HDL: 77.6 mg/dL (ref >39.00)
LDL Cholesterol: 107 mg/dL — ABNORMAL HIGH (ref 0–99)
NonHDL: 115.71
Total CHOL/HDL Ratio: 2
Triglycerides: 46 mg/dL (ref 0.0–149.0)
VLDL: 9.2 mg/dL (ref 0.0–40.0)

## 2019-08-03 LAB — VITAMIN D 25 HYDROXY (VIT D DEFICIENCY, FRACTURES): VITD: 60.67 ng/mL (ref 30.00–100.00)

## 2019-08-03 LAB — PSA, MEDICARE: PSA: 2.76 ng/ml (ref 0.10–4.00)

## 2019-08-03 MED ORDER — AMLODIPINE BESYLATE 10 MG PO TABS: ORAL_TABLET | ORAL | 3 refills | Status: DC

## 2019-08-03 MED ORDER — LOSARTAN POTASSIUM 100 MG PO TABS: ORAL_TABLET | ORAL | 3 refills | Status: DC

## 2019-08-03 NOTE — Addendum Note (Signed)
Addended by: Marcina Millard on: 08/03/2019 11:20 AM   Modules accepted: Orders

## 2019-08-03 NOTE — Patient Instructions (Signed)
Please get your Shingrix at the pharmacy; Please complete Cologuard; Please see your dermatologist for yearly check up;

## 2019-08-03 NOTE — Progress Notes (Signed)
Travis Bell is a 73 y.o. male with the following history as recorded in Epic[facility]:  Patient Active Problem List   Diagnosis Date Noted  . Spasm of cervical paraspinous muscle 02/26/2017  . Squamous cell carcinoma 04/29/2016  . Benign skin lesion of thigh 04/22/2016  . Hip flexor tendon tightness 06/22/2015  . Routine general medical examination at a health care facility 04/06/2015  . Anemia 04/07/2013  . HYPERTENSION, BENIGN ESSENTIAL 09/06/2010  . COLONIC POLYPS 04/28/2008  . Hyperlipidemia 04/28/2008  . ALLERGIC RHINITIS 04/28/2008  . DIVERTICULOSIS OF COLON 04/28/2008  . LENTIGO 04/28/2008  . ABNORMAL ELECTROCARDIOGRAM 04/28/2008  . ANXIETY DEPRESSION 04/27/2008  . HEADACHE 04/27/2008    Current Outpatient Medications  Medication Sig Dispense Refill  . amLODipine (NORVASC) 10 MG tablet TAKE 1 TABLET(10 MG) BY MOUTH DAILY 90 tablet 3  . Calcium Carbonate-Vitamin D (CALCIUM + D PO) Take by mouth daily.    . fish oil-omega-3 fatty acids 1000 MG capsule Take 2 g by mouth daily.    Marland Kitchen losartan (COZAAR) 100 MG tablet TAKE 1 TABLET(100 MG) BY MOUTH DAILY 90 tablet 3  . Multiple Vitamins-Minerals (MULTIVITAMIN PO) Take by mouth daily.    . Nutritional Supplements (JOINT FORMULA PO) Take by mouth daily.     No current facility-administered medications for this visit.     Allergies: Patient has no known allergies.  Past Medical History:  Diagnosis Date  . Allergic rhinitis   . Benign neoplasm of colon   . Bronchitis   . Colon polyps   . Diverticulosis of colon (without mention of hemorrhage)   . Dysthymic disorder   . Headache(784.0)   . HTN (hypertension)   . Lentigo   . Nonspecific abnormal electrocardiogram (ECG) (EKG)   . Sinusitis     History reviewed. No pertinent surgical history.  Family History  Problem Relation Age of Onset  . Heart disease Father   . Healthy Sister   . Stroke Mother     Social History   Tobacco Use  . Smoking status: Former Smoker   Packs/day: 0.50    Years: 15.00    Pack years: 7.50    Types: Cigarettes    Quit date: 09/30/1977    Years since quitting: 41.8  . Smokeless tobacco: Never Used  Substance Use Topics  . Alcohol use: Yes    Comment: 2 drinks per day    Subjective:  Patient presents for yearly CPE; in baseline state of health today; Active runner- feels weight is down due to loss of muscle mass/ not able to go to gym due to COVID;   Health Maintenance  Topic Date Due  . INFLUENZA VACCINE  05/01/2019  . COLONOSCOPY  08/02/2020 (Originally 07/15/2012)  . TETANUS/TDAP  12/09/2019  . Hepatitis C Screening  Completed  . PNA vac Low Risk Adult  Completed    Review of Systems  Constitutional: Positive for weight loss.       Per patient, feels he has lost muscle mass; no concerns  HENT: Negative.   Eyes: Negative.   Respiratory: Negative.   Cardiovascular: Negative.   Gastrointestinal: Negative.   Genitourinary: Negative.   Musculoskeletal: Negative.   Skin: Negative.   Neurological: Negative.   Endo/Heme/Allergies: Negative.   Psychiatric/Behavioral: Negative.        Objective:  Vitals:   08/03/19 0923  BP: 124/76  Pulse: (!) 59  Temp: 98 F (36.7 C)  TempSrc: Oral  SpO2: 99%  Weight: 151 lb 3.2 oz (68.6 kg)  Height: 5' 9"  (1.753 m)    General: Well developed, well nourished, in no acute distress  Skin : Warm and dry.  Head: Normocephalic and atraumatic  Eyes: Sclera and conjunctiva clear; pupils round and reactive to light; extraocular movements intact  Ears: External normal; canals clear; tympanic membranes normal  Oropharynx: Pink, supple. No suspicious lesions  Neck: Supple without thyromegaly, adenopathy  Lungs: Respirations unlabored; clear to auscultation bilaterally without wheeze, rales, rhonchi  CVS exam: normal rate and regular rhythm.  Abdomen: Soft; nontender; nondistended; normoactive bowel sounds; no masses or hepatosplenomegaly  Musculoskeletal: No deformities; no  active joint inflammation  Extremities: No edema, cyanosis, clubbing  Vessels: Symmetric bilaterally  Neurologic: Alert and oriented; speech intact; face symmetrical; moves all extremities well; CNII-XII intact without focal deficit  Assessment:  1. PE (physical exam), annual   2. HYPERTENSION, BENIGN ESSENTIAL   3. Lipid screening   4. Vitamin D deficiency   5. Prostate cancer screening     Plan:  Age appropriate preventive healthcare needs addressed; encouraged regular eye doctor and dental exams; encouraged regular exercise; will update labs and refills as needed today; follow-up to be determined; Encouraged to see dermatology for yearly follow-up and complete Cologuard; Flu shot given in office; he will go to pharmacy for Shingrix vaccine.     No follow-ups on file.  Orders Placed This Encounter  Procedures  . CBC w/Diff    Standing Status:   Future    Standing Expiration Date:   08/02/2020  . Comp Met (CMET)    Standing Status:   Future    Standing Expiration Date:   08/02/2020  . Lipid panel    Standing Status:   Future    Standing Expiration Date:   08/02/2020  . TSH    Standing Status:   Future    Standing Expiration Date:   08/02/2020  . Vitamin D (25 hydroxy)    Standing Status:   Future    Standing Expiration Date:   08/02/2020  . PSA, Medicare    Standing Status:   Future    Standing Expiration Date:   08/02/2020    Requested Prescriptions   Signed Prescriptions Disp Refills  . amLODipine (NORVASC) 10 MG tablet 90 tablet 3    Sig: TAKE 1 TABLET(10 MG) BY MOUTH DAILY  . losartan (COZAAR) 100 MG tablet 90 tablet 3    Sig: TAKE 1 TABLET(100 MG) BY MOUTH DAILY

## 2019-08-04 ENCOUNTER — Other Ambulatory Visit: Payer: Self-pay | Admitting: Family

## 2019-08-04 DIAGNOSIS — R7309 Other abnormal glucose: Secondary | ICD-10-CM

## 2019-10-29 ENCOUNTER — Ambulatory Visit: Payer: Medicare HMO

## 2019-11-06 ENCOUNTER — Ambulatory Visit: Payer: Medicare HMO

## 2019-11-15 ENCOUNTER — Ambulatory Visit: Payer: Medicare HMO

## 2019-11-24 ENCOUNTER — Encounter: Payer: Self-pay | Admitting: *Deleted

## 2019-12-15 ENCOUNTER — Ambulatory Visit: Payer: Medicare HMO | Admitting: Physician Assistant

## 2019-12-27 DIAGNOSIS — R69 Illness, unspecified: Secondary | ICD-10-CM | POA: Diagnosis not present

## 2020-01-10 DIAGNOSIS — R69 Illness, unspecified: Secondary | ICD-10-CM | POA: Diagnosis not present

## 2020-02-02 DIAGNOSIS — R69 Illness, unspecified: Secondary | ICD-10-CM | POA: Diagnosis not present

## 2020-05-16 ENCOUNTER — Ambulatory Visit (INDEPENDENT_AMBULATORY_CARE_PROVIDER_SITE_OTHER): Payer: Medicare HMO | Admitting: Family

## 2020-05-16 ENCOUNTER — Other Ambulatory Visit: Payer: Self-pay

## 2020-05-16 ENCOUNTER — Encounter: Payer: Self-pay | Admitting: Family

## 2020-05-16 VITALS — BP 116/72 | HR 77 | Temp 98.2°F | Ht 69.0 in | Wt 157.0 lb

## 2020-05-16 DIAGNOSIS — H6121 Impacted cerumen, right ear: Secondary | ICD-10-CM | POA: Diagnosis not present

## 2020-05-16 NOTE — Progress Notes (Signed)
Travis Bell is a 74 y.o. male with the following history as recorded in EpicCare:  Patient Active Problem List   Diagnosis Date Noted  . Spasm of cervical paraspinous muscle 02/26/2017  . Squamous cell carcinoma 04/29/2016  . Benign skin lesion of thigh 04/22/2016  . Hip flexor tendon tightness 06/22/2015  . Routine general medical examination at a health care facility 04/06/2015  . Anemia 04/07/2013  . HYPERTENSION, BENIGN ESSENTIAL 09/06/2010  . COLONIC POLYPS 04/28/2008  . Hyperlipidemia 04/28/2008  . ALLERGIC RHINITIS 04/28/2008  . DIVERTICULOSIS OF COLON 04/28/2008  . LENTIGO 04/28/2008  . ABNORMAL ELECTROCARDIOGRAM 04/28/2008  . ANXIETY DEPRESSION 04/27/2008  . HEADACHE 04/27/2008    Current Outpatient Medications  Medication Sig Dispense Refill  . amLODipine (NORVASC) 10 MG tablet TAKE 1 TABLET(10 MG) BY MOUTH DAILY 90 tablet 3  . Calcium Carbonate-Vitamin D (CALCIUM + D PO) Take by mouth daily.    . fish oil-omega-3 fatty acids 1000 MG capsule Take 2 g by mouth daily.    Marland Kitchen losartan (COZAAR) 100 MG tablet TAKE 1 TABLET(100 MG) BY MOUTH DAILY 90 tablet 3  . Multiple Vitamins-Minerals (MULTIVITAMIN PO) Take by mouth daily.    . Nutritional Supplements (JOINT FORMULA PO) Take by mouth daily.     No current facility-administered medications for this visit.    Allergies: Patient has no known allergies.  Past Medical History:  Diagnosis Date  . Allergic rhinitis   . Benign neoplasm of colon   . Bronchitis   . Colon polyps   . Diverticulosis of colon (without mention of hemorrhage)   . Dysthymic disorder   . Headache(784.0)   . HTN (hypertension)   . Lentigo   . Nonspecific abnormal electrocardiogram (ECG) (EKG)   . SCC (squamous cell carcinoma) 04/22/2016   RIGHT THIGH-CX3,5FU  . SCC (squamous cell carcinoma) 06/06/2016   LEFT INNER [id number]  . Sinusitis     No past surgical history on file.  Family History  Problem Relation Age of Onset  . Heart disease  Father   . Healthy Sister   . Stroke Mother     Social History   Tobacco Use  . Smoking status: Former Smoker    Packs/day: 0.50    Years: 15.00    Pack years: 7.50    Types: Cigarettes    Quit date: 09/30/1977    Years since quitting: 42.6  . Smokeless tobacco: Never Used  Substance Use Topics  . Alcohol use: Yes    Comment: 2 drinks per day    Subjective:  Requesting ear lavage- right ear "feels clogged." Has had to have ear wax removed in the past;   Objective:  Vitals:   05/16/20 0838  BP: 116/72  Pulse: 77  Temp: 98.2 F (36.8 C)  TempSrc: Oral  SpO2: 97%  Weight: 157 lb (71.2 kg)  Height: 5\' 9"  (1.753 m)    General: Well developed, well nourished, in no acute distress  Skin : Warm and dry.  Head: Normocephalic and atraumatic  Ears: External normal; cerumen impaction noted initially; after lavage, canals clear; tympanic membranes normal  Lungs: Respirations unlabored; clear to auscultation bilaterally without wheeze, rales, rhonchi  Neurologic: Alert and oriented; speech intact; face symmetrical; moves all extremities well; CNII-XII intact without focal deficit   Assessment:  1. Impacted cerumen of right ear     Plan:   Ear lavage completed with no difficulty; he will follow-up if symptoms persist;  Otherwise, follow-up for CPE in about 3 months;  This visit occurred during the SARS-CoV-2 public health emergency.  Safety protocols were in place, including screening questions prior to the visit, additional usage of staff PPE, and extensive cleaning of exam room while observing appropriate contact time as indicated for disinfecting solutions.     No follow-ups on file.  No orders of the defined types were placed in this encounter.   Requested Prescriptions    No prescriptions requested or ordered in this encounter

## 2020-05-16 NOTE — Patient Instructions (Signed)
You can try using OTC Debrox to help prevent the ear wax from building up in the ear;

## 2020-08-01 DIAGNOSIS — S2231XA Fracture of one rib, right side, initial encounter for closed fracture: Secondary | ICD-10-CM | POA: Diagnosis not present

## 2020-08-09 ENCOUNTER — Telehealth: Payer: Self-pay | Admitting: Family

## 2020-08-09 NOTE — Telephone Encounter (Signed)
LVM for pt to rtn my call to schedule AWV with [facility]. Please schedule appt if pt calls the office.  

## 2020-08-15 ENCOUNTER — Other Ambulatory Visit: Payer: Self-pay

## 2020-08-15 ENCOUNTER — Ambulatory Visit (INDEPENDENT_AMBULATORY_CARE_PROVIDER_SITE_OTHER): Payer: Medicare HMO | Admitting: Family

## 2020-08-15 ENCOUNTER — Encounter: Payer: Self-pay | Admitting: Family

## 2020-08-15 VITALS — BP 138/82 | HR 75 | Temp 97.5°F | Ht 69.0 in | Wt 154.6 lb

## 2020-08-15 DIAGNOSIS — Z23 Encounter for immunization: Secondary | ICD-10-CM

## 2020-08-15 DIAGNOSIS — Z0000 Encounter for general adult medical examination without abnormal findings: Secondary | ICD-10-CM

## 2020-08-15 DIAGNOSIS — I10 Essential (primary) hypertension: Secondary | ICD-10-CM | POA: Diagnosis not present

## 2020-08-15 DIAGNOSIS — Z125 Encounter for screening for malignant neoplasm of prostate: Secondary | ICD-10-CM

## 2020-08-15 DIAGNOSIS — Z8781 Personal history of (healed) traumatic fracture: Secondary | ICD-10-CM | POA: Diagnosis not present

## 2020-08-15 DIAGNOSIS — Z13220 Encounter for screening for lipoid disorders: Secondary | ICD-10-CM | POA: Diagnosis not present

## 2020-08-15 LAB — LIPID PANEL
Cholesterol: 205 mg/dL — ABNORMAL HIGH (ref 0–200)
HDL: 73.4 mg/dL (ref >39.00)
LDL Cholesterol: 119 mg/dL — ABNORMAL HIGH (ref 0–99)
NonHDL: 132.07
Total CHOL/HDL Ratio: 3
Triglycerides: 64 mg/dL (ref 0.0–149.0)
VLDL: 12.8 mg/dL (ref 0.0–40.0)

## 2020-08-15 LAB — CBC WITH DIFFERENTIAL/PLATELET
Basophils Absolute: 0 10*3/uL (ref 0.0–0.1)
Basophils Relative: 0.9 % (ref 0.0–3.0)
Eosinophils Absolute: 0.1 10*3/uL (ref 0.0–0.7)
Eosinophils Relative: 2.5 % (ref 0.0–5.0)
HCT: 42.1 % (ref 39.0–52.0)
Hemoglobin: 14.5 g/dL (ref 13.0–17.0)
Lymphocytes Relative: 33.2 % (ref 12.0–46.0)
Lymphs Abs: 1.5 10*3/uL (ref 0.7–4.0)
MCHC: 34.4 g/dL (ref 30.0–36.0)
MCV: 90.9 fl (ref 78.0–100.0)
Monocytes Absolute: 0.4 10*3/uL (ref 0.1–1.0)
Monocytes Relative: 9.4 % (ref 3.0–12.0)
Neutro Abs: 2.5 10*3/uL (ref 1.4–7.7)
Neutrophils Relative %: 54 % (ref 43.0–77.0)
Platelets: 289 10*3/uL (ref 150.0–400.0)
RBC: 4.63 Mil/uL (ref 4.22–5.81)
RDW: 13 % (ref 11.5–15.5)
WBC: 4.7 10*3/uL (ref 4.0–10.5)

## 2020-08-15 LAB — COMPREHENSIVE METABOLIC PANEL
ALT: 21 U/L (ref 0–53)
AST: 29 U/L (ref 0–37)
Albumin: 4.6 g/dL (ref 3.5–5.2)
Alkaline Phosphatase: 66 U/L (ref 39–117)
BUN: 24 mg/dL — ABNORMAL HIGH (ref 6–23)
CO2: 29 mEq/L (ref 19–32)
Calcium: 9.5 mg/dL (ref 8.4–10.5)
Chloride: 100 mEq/L (ref 96–112)
Creatinine, Ser: 0.83 mg/dL (ref 0.40–1.50)
GFR: 87.72 mL/min (ref >60.00)
Glucose, Bld: 114 mg/dL — ABNORMAL HIGH (ref 70–99)
Potassium: 3.7 mEq/L (ref 3.5–5.1)
Sodium: 136 mEq/L (ref 135–145)
Total Bilirubin: 0.8 mg/dL (ref 0.2–1.2)
Total Protein: 7.2 g/dL (ref 6.0–8.3)

## 2020-08-15 LAB — PSA: PSA: 3.21 ng/mL (ref 0.10–4.00)

## 2020-08-15 MED ORDER — LOSARTAN POTASSIUM 100 MG PO TABS: ORAL_TABLET | ORAL | 3 refills | Status: DC

## 2020-08-15 MED ORDER — AMLODIPINE BESYLATE 10 MG PO TABS: ORAL_TABLET | ORAL | 3 refills | Status: DC

## 2020-08-15 NOTE — Progress Notes (Signed)
Travis Bell is a 74 y.o. male with the following history as recorded in EpicCare:  Patient Active Problem List   Diagnosis Date Noted  . Spasm of cervical paraspinous muscle 02/26/2017  . Squamous cell carcinoma 04/29/2016  . Benign skin lesion of thigh 04/22/2016  . Hip flexor tendon tightness 06/22/2015  . Routine general medical examination at a health care facility 04/06/2015  . Anemia 04/07/2013  . HYPERTENSION, BENIGN ESSENTIAL 09/06/2010  . COLONIC POLYPS 04/28/2008  . Hyperlipidemia 04/28/2008  . ALLERGIC RHINITIS 04/28/2008  . DIVERTICULOSIS OF COLON 04/28/2008  . LENTIGO 04/28/2008  . ABNORMAL ELECTROCARDIOGRAM 04/28/2008  . ANXIETY DEPRESSION 04/27/2008  . HEADACHE 04/27/2008    Current Outpatient Medications  Medication Sig Dispense Refill  . amLODipine (NORVASC) 10 MG tablet TAKE 1 TABLET(10 MG) BY MOUTH DAILY 90 tablet 3  . Calcium Carbonate-Vitamin D (CALCIUM + D PO) Take by mouth daily.    . fish oil-omega-3 fatty acids 1000 MG capsule Take 2 g by mouth daily.    Marland Kitchen losartan (COZAAR) 100 MG tablet TAKE 1 TABLET(100 MG) BY MOUTH DAILY 90 tablet 3  . Multiple Vitamins-Minerals (MULTIVITAMIN PO) Take by mouth daily.    . Nutritional Supplements (JOINT FORMULA PO) Take by mouth daily.     No current facility-administered medications for this visit.    Allergies: Patient has no known allergies.  Past Medical History:  Diagnosis Date  . Allergic rhinitis   . Benign neoplasm of colon   . Bronchitis   . Colon polyps   . Diverticulosis of colon (without mention of hemorrhage)   . Dysthymic disorder   . Headache(784.0)   . HTN (hypertension)   . Lentigo   . Nonspecific abnormal electrocardiogram (ECG) (EKG)   . SCC (squamous cell carcinoma) 04/22/2016   RIGHT THIGH-CX3,5FU  . SCC (squamous cell carcinoma) 06/06/2016   LEFT INNER SHIN-TXpBX  . Sinusitis     History reviewed. No pertinent surgical history.  Family History  Problem Relation Age of Onset  .  Heart disease Father   . Healthy Sister   . Stroke Mother     Social History   Tobacco Use  . Smoking status: Former Smoker    Packs/day: 0.50    Years: 15.00    Pack years: 7.50    Types: Cigarettes    Quit date: 09/30/1977    Years since quitting: 42.9  . Smokeless tobacco: Never Used  Substance Use Topics  . Alcohol use: Yes    Comment: 2 drinks per day    Subjective:  Patient presents for yearly CPE; had a fall while running a few weeks and is recovering well; did fracture right rib ( 5th) but feels that he is healing well; Would like flu shot and Tdap today; needs refills on medications; Does not want to update colonoscopy- did stool cards as alternative last year;   Review of Systems  Constitutional: Negative.   HENT: Negative.   Eyes: Negative.   Respiratory: Negative.   Cardiovascular: Negative.   Gastrointestinal: Negative.   Genitourinary: Negative.   Musculoskeletal: Negative.   Skin: Negative.   Neurological: Negative.   Psychiatric/Behavioral: Negative.      Objective:  Vitals:   08/15/20 0851  BP: 138/82  Pulse: 75  Temp: (!) 97.5 F (36.4 C)  TempSrc: Oral  SpO2: 98%  Weight: 154 lb 9.6 oz (70.1 kg)  Height: 5' 9" (1.753 m)    General: Well developed, well nourished, in no acute distress  Skin :  Warm and dry.  Head: Normocephalic and atraumatic  Eyes: Sclera and conjunctiva clear; pupils round and reactive to light; extraocular movements intact  Ears: External normal; canals clear; tympanic membranes normal  Oropharynx: Pink, supple. No suspicious lesions  Neck: Supple without thyromegaly, adenopathy  Lungs: Respirations unlabored; clear to auscultation bilaterally without wheeze, rales, rhonchi  CVS exam: normal rate and regular rhythm.  Abdomen: Soft; nontender; nondistended; normoactive bowel sounds; no masses or hepatosplenomegaly  Musculoskeletal: No deformities; no active joint inflammation  Extremities: No edema, cyanosis, clubbing   Vessels: Symmetric bilaterally  Neurologic: Alert and oriented; speech intact; face symmetrical; moves all extremities well; CNII-XII intact without focal deficit  Assessment:  1. PE (physical exam), annual   2. History of rib fracture   3. HYPERTENSION, BENIGN ESSENTIAL   4. Lipid screening   5. Prostate cancer screening     Plan:  Age appropriate preventive healthcare needs addressed; encouraged regular eye doctor and dental exams; encouraged regular exercise; will update labs and refills as needed today; follow-up to be determined; Flu and Tdap given;  He will plan for repeat X-ray of right rib in 2 weeks at [facility];   This visit occurred during the SARS-CoV-2 public health emergency.  Safety protocols were in place, including screening questions prior to the visit, additional usage of staff PPE, and extensive cleaning of exam room while observing appropriate contact time as indicated for disinfecting solutions.     No follow-ups on file.  Orders Placed This Encounter  Procedures  . DG Ribs Unilateral Right    Standing Status:   Future    Standing Expiration Date:   08/15/2021    Order Specific Question:   Reason for Exam (SYMPTOM  OR DIAGNOSIS REQUIRED)    Answer:   history of rib fracture    Order Specific Question:   Preferred imaging location?    Answer:   [facility]  . CBC with Differential/Platelet    Standing Status:   Future    Standing Expiration Date:   08/15/2021  . Comp Met (CMET)    Standing Status:   Future    Standing Expiration Date:   08/15/2021  . Lipid panel    Standing Status:   Future    Standing Expiration Date:   08/15/2021  . PSA    Standing Status:   Future    Standing Expiration Date:   08/15/2021    Requested Prescriptions   Signed Prescriptions Disp Refills  . losartan (COZAAR) 100 MG tablet 90 tablet 3    Sig: TAKE 1 TABLET(100 MG) BY MOUTH DAILY  . amLODipine (NORVASC) 10 MG tablet 90 tablet 3    Sig: TAKE 1 TABLET(10 MG) BY MOUTH  DAILY

## 2020-08-16 ENCOUNTER — Other Ambulatory Visit: Payer: Self-pay | Admitting: Family

## 2020-08-16 DIAGNOSIS — R899 Unspecified abnormal finding in specimens from other organs, systems and tissues: Secondary | ICD-10-CM

## 2020-08-28 ENCOUNTER — Ambulatory Visit (INDEPENDENT_AMBULATORY_CARE_PROVIDER_SITE_OTHER)
Admission: RE | Admit: 2020-08-28 | Discharge: 2020-08-28 | Disposition: A | Discharge status: Home / Self Care | Payer: Medicare HMO | Source: Ambulatory Visit | Attending: Family | Admitting: Family

## 2020-08-28 ENCOUNTER — Other Ambulatory Visit: Payer: Self-pay

## 2020-08-28 DIAGNOSIS — Z8781 Personal history of (healed) traumatic fracture: Secondary | ICD-10-CM

## 2020-08-28 DIAGNOSIS — S2231XA Fracture of one rib, right side, initial encounter for closed fracture: Secondary | ICD-10-CM | POA: Diagnosis not present

## 2020-10-30 DIAGNOSIS — M65871 Other synovitis and tenosynovitis, right ankle and foot: Secondary | ICD-10-CM | POA: Diagnosis not present

## 2020-10-30 DIAGNOSIS — M19071 Primary osteoarthritis, right ankle and foot: Secondary | ICD-10-CM | POA: Diagnosis not present

## 2020-10-30 DIAGNOSIS — M79671 Pain in right foot: Secondary | ICD-10-CM | POA: Diagnosis not present

## 2020-10-30 DIAGNOSIS — M25571 Pain in right ankle and joints of right foot: Secondary | ICD-10-CM | POA: Diagnosis not present

## 2020-11-13 DIAGNOSIS — M25571 Pain in right ankle and joints of right foot: Secondary | ICD-10-CM | POA: Diagnosis not present

## 2020-12-04 ENCOUNTER — Telehealth: Payer: Medicare HMO | Admitting: Family

## 2020-12-04 NOTE — Telephone Encounter (Signed)
LVM for pt to rtn my call to schedule AWV with [facility]. Please schedule AWV if pt calls the office  

## 2021-01-09 ENCOUNTER — Encounter: Payer: Self-pay | Admitting: Family

## 2021-04-14 ENCOUNTER — Encounter: Payer: Self-pay | Admitting: Family

## 2021-04-16 ENCOUNTER — Other Ambulatory Visit: Payer: Self-pay | Admitting: Family

## 2021-04-16 DIAGNOSIS — U071 COVID-19: Secondary | ICD-10-CM | POA: Diagnosis not present

## 2021-04-17 NOTE — Telephone Encounter (Signed)
I have called pt to gather more information from provider. There is no answer so I left a message to call back.

## 2021-04-17 NOTE — Telephone Encounter (Signed)
Once again I have called pt to gather more information. No answer and left a message to call back.

## 2021-08-07 NOTE — Progress Notes (Signed)
   I, Travis Bell, LAT, ATC, am serving as scribe for Dr. Lynne Leader.  CHANCELER PULLIN is a 75 y.o. male who presents to [place] at [facility] today for L hip pain.  He was last seen by Dr. Tamala Julian for his R hip in 2016.  Today, he reports L hip pain for a while.  He locates his pain to his L ischial tuberosity.  He will be running a marathon in one week (next Sunday). He is looking for some temporary relief to get ready for his marathon and states that prednisone helped a lot previously.  Radiating pain: No Aggravating factors: running;  Treatments tried: CBD cream; IBU  Diagnostic imaging: L-spine XR- 09/15/15  Pertinent review of systems: No fevers or chills  Relevant historical information: Hypertension   Exam:  BP 128/78 (BP Location: Right Arm, Patient Position: Sitting, Cuff Size: Normal)   Pulse 80   Ht 5\' 9"  (1.753 m)   Wt 156 lb (70.8 kg)   SpO2 99%   BMI 23.04 kg/m  General: Well Developed, well nourished, and in no acute distress.   MSK: Left hip normal-appearing Tender palpation ischial tuberosity hamstring insertion. Normal hip motion. Strength.  Hip abduction diminished 4/5.  External rotation intact.  Extension intact although this does reproduce pain. Resisted knee flexion also reproduces pain. Positive left-sided H test.  Lumbar range of motion intact with the exception of lumbar flexion slightly limited.     Assessment and Plan: 75 y.o. male with left posterior hip pain at hamstring insertion on ischial tuberosity.  Patient has a marathon in 10 days and another marathon about 7 weeks after that.  He is a successful and avid runner in his 60s.  Plan for short course of prednisone as this has worked well for him in the past.  However he will significantly benefit from physical therapy and especially focus on hip abduction strengthening and hamstring rehab strengthening and stretching.  Home exercise program taught in clinic today with ATC  although PT will be essential.  Recheck back as needed.   PDMP not reviewed this encounter. Orders Placed This Encounter  Procedures   Ambulatory referral to Physical Therapy    Referral Priority:   Routine    Referral Type:   Physical Medicine    Referral Reason:   Specialty Services Required    Requested Specialty:   Physical Therapy    Number of Visits Requested:   1   Meds ordered this encounter  Medications   predniSONE (DELTASONE) 50 MG tablet    Sig: Take 1 tablet (50 mg total) by mouth daily with breakfast for 5 days.    Dispense:  5 tablet    Refill:  0     Discussed warning signs or symptoms. Please see discharge instructions. Patient expresses understanding.   The above documentation has been reviewed and is accurate and complete Lynne Leader, M.D.

## 2021-08-08 ENCOUNTER — Encounter: Payer: Self-pay | Admitting: Family Medicine

## 2021-08-08 ENCOUNTER — Other Ambulatory Visit: Payer: Self-pay

## 2021-08-08 ENCOUNTER — Ambulatory Visit: Payer: Medicare HMO | Admitting: Family Medicine

## 2021-08-08 VITALS — BP 128/78 | HR 80 | Ht 69.0 in | Wt 156.0 lb

## 2021-08-08 DIAGNOSIS — G8929 Other chronic pain: Secondary | ICD-10-CM

## 2021-08-08 DIAGNOSIS — M25552 Pain in left hip: Secondary | ICD-10-CM | POA: Diagnosis not present

## 2021-08-08 MED ORDER — PREDNISONE 50 MG PO TABS: 50.0000 mg | ORAL_TABLET | Freq: Every day | ORAL | 0 refills | Status: AC

## 2021-08-08 NOTE — Patient Instructions (Addendum)
Nice to meet you today.  I've prescribed you prednisone.  I've referred you to physical therapy.  Please let us know if you haven't heard from them in one week regarding scheduling  Good luck on your marathon!  Follow-up: as needed

## 2021-08-23 ENCOUNTER — Other Ambulatory Visit: Payer: Self-pay | Admitting: Family

## 2021-08-23 DIAGNOSIS — I10 Essential (primary) hypertension: Secondary | ICD-10-CM

## 2021-08-29 DIAGNOSIS — M25552 Pain in left hip: Secondary | ICD-10-CM | POA: Diagnosis not present

## 2021-08-29 DIAGNOSIS — M25551 Pain in right hip: Secondary | ICD-10-CM | POA: Diagnosis not present

## 2021-09-12 DIAGNOSIS — M25552 Pain in left hip: Secondary | ICD-10-CM | POA: Diagnosis not present

## 2021-09-12 DIAGNOSIS — M25551 Pain in right hip: Secondary | ICD-10-CM | POA: Diagnosis not present

## 2021-10-02 ENCOUNTER — Ambulatory Visit (INDEPENDENT_AMBULATORY_CARE_PROVIDER_SITE_OTHER): Payer: Medicare HMO | Admitting: Family

## 2021-10-02 ENCOUNTER — Encounter: Payer: Self-pay | Admitting: Family Medicine

## 2021-10-02 VITALS — BP 136/80 | HR 68 | Temp 98.0°F | Ht 69.0 in | Wt 154.6 lb

## 2021-10-02 DIAGNOSIS — I10 Essential (primary) hypertension: Secondary | ICD-10-CM

## 2021-10-02 DIAGNOSIS — Z0000 Encounter for general adult medical examination without abnormal findings: Secondary | ICD-10-CM

## 2021-10-02 DIAGNOSIS — Z13220 Encounter for screening for lipoid disorders: Secondary | ICD-10-CM | POA: Diagnosis not present

## 2021-10-02 DIAGNOSIS — Z125 Encounter for screening for malignant neoplasm of prostate: Secondary | ICD-10-CM

## 2021-10-02 LAB — COMPREHENSIVE METABOLIC PANEL
ALT: 18 U/L (ref 0–53)
AST: 23 U/L (ref 0–37)
Albumin: 4.5 g/dL (ref 3.5–5.2)
Alkaline Phosphatase: 50 U/L (ref 39–117)
BUN: 21 mg/dL (ref 6–23)
CO2: 26 mEq/L (ref 19–32)
Calcium: 9.5 mg/dL (ref 8.4–10.5)
Chloride: 100 mEq/L (ref 96–112)
Creatinine, Ser: 0.85 mg/dL (ref 0.40–1.50)
GFR: 86.4 mL/min (ref >60.00)
Glucose, Bld: 109 mg/dL — ABNORMAL HIGH (ref 70–99)
Potassium: 4.3 mEq/L (ref 3.5–5.1)
Sodium: 136 mEq/L (ref 135–145)
Total Bilirubin: 1 mg/dL (ref 0.2–1.2)
Total Protein: 6.7 g/dL (ref 6.0–8.3)

## 2021-10-02 LAB — CBC WITH DIFFERENTIAL/PLATELET
Basophils Absolute: 0 10*3/uL (ref 0.0–0.1)
Basophils Relative: 0.9 % (ref 0.0–3.0)
Eosinophils Absolute: 0.1 10*3/uL (ref 0.0–0.7)
Eosinophils Relative: 2.9 % (ref 0.0–5.0)
HCT: 42.2 % (ref 39.0–52.0)
Hemoglobin: 14 g/dL (ref 13.0–17.0)
Lymphocytes Relative: 39.7 % (ref 12.0–46.0)
Lymphs Abs: 1.6 10*3/uL (ref 0.7–4.0)
MCHC: 33.2 g/dL (ref 30.0–36.0)
MCV: 92.5 fl (ref 78.0–100.0)
Monocytes Absolute: 0.4 10*3/uL (ref 0.1–1.0)
Monocytes Relative: 10.6 % (ref 3.0–12.0)
Neutro Abs: 1.9 10*3/uL (ref 1.4–7.7)
Neutrophils Relative %: 45.9 % (ref 43.0–77.0)
Platelets: 268 10*3/uL (ref 150.0–400.0)
RBC: 4.56 Mil/uL (ref 4.22–5.81)
RDW: 13.2 % (ref 11.5–15.5)
WBC: 4.1 10*3/uL (ref 4.0–10.5)

## 2021-10-02 LAB — LIPID PANEL
Cholesterol: 212 mg/dL — ABNORMAL HIGH (ref 0–200)
HDL: 74.7 mg/dL (ref >39.00)
LDL Cholesterol: 127 mg/dL — ABNORMAL HIGH (ref 0–99)
NonHDL: 137.48
Total CHOL/HDL Ratio: 3
Triglycerides: 54 mg/dL (ref 0.0–149.0)
VLDL: 10.8 mg/dL (ref 0.0–40.0)

## 2021-10-02 LAB — PSA: PSA: 4.07 ng/mL — ABNORMAL HIGH (ref 0.10–4.00)

## 2021-10-02 MED ORDER — LOSARTAN POTASSIUM 100 MG PO TABS: ORAL_TABLET | ORAL | 3 refills | Status: DC

## 2021-10-02 MED ORDER — AMLODIPINE BESYLATE 10 MG PO TABS: ORAL_TABLET | ORAL | 3 refills | Status: DC

## 2021-10-02 MED ORDER — PREDNISONE 50 MG PO TABS: 50.0000 mg | ORAL_TABLET | Freq: Every day | ORAL | 0 refills | Status: DC

## 2021-10-02 NOTE — Progress Notes (Signed)
°Travis Bell is a 75 y.o. male with the following history as recorded in EpicCare:  °Patient Active Problem List  ° Diagnosis Date Noted  ° Spasm of cervical paraspinous muscle 02/26/2017  ° Squamous cell carcinoma 04/29/2016  ° Benign skin lesion of thigh 04/22/2016  ° Hip flexor tendon tightness 06/22/2015  ° Routine general medical examination at a health care facility 04/06/2015  ° Anemia 04/07/2013  ° HYPERTENSION, BENIGN ESSENTIAL 09/06/2010  ° COLONIC POLYPS 04/28/2008  ° Hyperlipidemia 04/28/2008  ° ALLERGIC RHINITIS 04/28/2008  ° DIVERTICULOSIS OF COLON 04/28/2008  ° LENTIGO 04/28/2008  ° ABNORMAL ELECTROCARDIOGRAM 04/28/2008  ° ANXIETY DEPRESSION 04/27/2008  ° HEADACHE 04/27/2008  °  °Current Outpatient Medications  °Medication Sig Dispense Refill  ° Calcium Carbonate-Vitamin D (CALCIUM + D PO) Take by mouth daily.    ° fish oil-omega-3 fatty acids 1000 MG capsule Take 2 g by mouth daily.    ° Multiple Vitamins-Minerals (MULTIVITAMIN PO) Take by mouth daily.    ° Nutritional Supplements (JOINT FORMULA PO) Take by mouth daily.    ° amLODipine (NORVASC) 10 MG tablet TAKE 1 TABLET BY MOUTH EVERY DAY 90 tablet 3  ° losartan (COZAAR) 100 MG tablet TAKE 1 TABLET BY MOUTH EVERY DAY 90 tablet 3  ° °No current facility-administered medications for this visit.  °  °Allergies: Patient has no known allergies.  °Past Medical History:  °Diagnosis Date  ° Allergic rhinitis   ° Benign neoplasm of colon   ° Bronchitis   ° Colon polyps   ° Diverticulosis of colon (without mention of hemorrhage)   ° Dysthymic disorder   ° Headache(784.0)   ° HTN (hypertension)   ° Lentigo   ° Nonspecific abnormal electrocardiogram (ECG) (EKG)   ° SCC (squamous cell carcinoma) 04/22/2016  ° RIGHT THIGH-CX3,5FU  ° SCC (squamous cell carcinoma) 06/06/2016  ° LEFT INNER SHIN-TXpBX  ° Sinusitis   °  °No past surgical history on file.  °Family History  °Problem Relation Age of Onset  ° Heart disease Father   ° Healthy Sister   ° Stroke Mother    °  °Social History  ° °Tobacco Use  ° Smoking status: Former  °  Packs/day: 0.50  °  Years: 15.00  °  Pack years: 7.50  °  Types: Cigarettes  °  Quit date: 09/30/1977  °  Years since quitting: 44.0  ° Smokeless tobacco: Never  °Substance Use Topics  ° Alcohol use: Yes  °  Comment: 2 drinks per day  °  °Subjective:  ° °Patient presents for yearly CPE; °Defers updating colonoscopy; stool cards were done in 2021;  °Had COVID in summer 2022- feels like has had some increased sinus congestion since that time; doing okay with OTC nasal steroid prn;  °Continuing to run regularly; will be going to [place] in the next week for a marathon;  °Up to date on dental and vision exams;  ° °Does check his blood pressure regularly and normal at home; Denies any chest pain, shortness of breath, blurred vision or headache. ° ° °Review of Systems  °Constitutional: Negative.   °HENT: Negative.    °Eyes: Negative.   °Respiratory: Negative.    °Cardiovascular: Negative.   °Gastrointestinal: Negative.   °Genitourinary: Negative.   °Musculoskeletal: Negative.   °Skin: Negative.   °Neurological: Negative.   °Endo/Heme/Allergies: Negative.   °Psychiatric/Behavioral: Negative.    ° ° ° ° °Objective:  °Vitals:  ° 10/02/21 0818  °BP: 136/80  °Pulse: 68  °  Temp: 98 F (36.7 C)  TempSrc: Oral  SpO2: 99%  Weight: 154 lb 9.6 oz (70.1 kg)  Height: 5' 9" (1.753 m)    General: Well developed, well nourished, in no acute distress  Skin : Warm and dry.  Head: Normocephalic and atraumatic  Eyes: Sclera and conjunctiva clear; pupils round and reactive to light; extraocular movements intact  Ears: External normal; canals clear; tympanic membranes normal  Oropharynx: Pink, supple. No suspicious lesions  Neck: Supple without thyromegaly, adenopathy  Lungs: Respirations unlabored; clear to auscultation bilaterally without wheeze, rales, rhonchi  CVS exam: normal rate and regular rhythm.  Abdomen: Soft; nontender; nondistended; normoactive  bowel sounds; no masses or hepatosplenomegaly  Musculoskeletal: No deformities; no active joint inflammation  Extremities: No edema, cyanosis, clubbing  Vessels: Symmetric bilaterally  Neurologic: Alert and oriented; speech intact; face symmetrical; moves all extremities well; CNII-XII intact without focal deficit   Assessment:  1. PE (physical exam), annual   2. Lipid screening   3. Prostate cancer screening   4. HYPERTENSION, BENIGN ESSENTIAL     Plan:  Age appropriate preventive healthcare needs addressed; encouraged regular eye doctor and dental exams; encouraged regular exercise; will update labs and refills as needed today; follow-up to be determined;   No follow-ups on file.  Orders Placed This Encounter  Procedures   CBC with Differential/Platelet   Comp Met (CMET)   Lipid panel   PSA    Requested Prescriptions   Signed Prescriptions Disp Refills   amLODipine (NORVASC) 10 MG tablet 90 tablet 3    Sig: TAKE 1 TABLET BY MOUTH EVERY DAY   losartan (COZAAR) 100 MG tablet 90 tablet 3    Sig: TAKE 1 TABLET BY MOUTH EVERY DAY

## 2021-10-04 ENCOUNTER — Other Ambulatory Visit (INDEPENDENT_AMBULATORY_CARE_PROVIDER_SITE_OTHER): Payer: Medicare HMO

## 2021-10-04 ENCOUNTER — Other Ambulatory Visit: Payer: Self-pay | Admitting: Family

## 2021-10-04 DIAGNOSIS — R972 Elevated prostate specific antigen [PSA]: Secondary | ICD-10-CM

## 2021-10-04 DIAGNOSIS — R7301 Impaired fasting glucose: Secondary | ICD-10-CM | POA: Diagnosis not present

## 2021-10-04 LAB — HEMOGLOBIN A1C: Hgb A1c MFr Bld: 5.9 % (ref 4.6–6.5)

## 2021-11-05 DIAGNOSIS — J019 Acute sinusitis, unspecified: Secondary | ICD-10-CM | POA: Diagnosis not present

## 2021-11-23 ENCOUNTER — Encounter: Payer: Self-pay | Admitting: Family

## 2021-12-21 ENCOUNTER — Telehealth: Payer: Self-pay | Admitting: Family

## 2021-12-21 NOTE — Telephone Encounter (Signed)
Left message for patient to call back and schedule Medicare Annual Wellness Visit (AWV) in office.  ? ?If not able to come in office, please offer to do virtually or by telephone.  Left office number and my jabber [phone]. ? ?Last AWV:06/23/2017 ? ?Please schedule at anytime with Nurse Health Advisor. ?  ?

## 2022-02-06 ENCOUNTER — Telehealth: Payer: Self-pay | Admitting: Family

## 2022-02-06 NOTE — Telephone Encounter (Signed)
Left message for patient to call back and schedule Medicare Annual Wellness Visit (AWV).   Please offer to do virtually or by telephone.  Left office number and my jabber #[phone].  Last AWV:06/23/2017  Please schedule at anytime with Nurse Health Advisor.   

## 2022-03-06 ENCOUNTER — Telehealth: Payer: Self-pay | Admitting: Family

## 2022-03-06 NOTE — Telephone Encounter (Signed)
Left message for patient to call back and schedule Medicare Annual Wellness Visit (AWV).   Please offer to do virtually or by telephone.  Left office number and my jabber #[phone].  Last AWV:06/23/2017  Please schedule at anytime with Nurse Health Advisor.   

## 2022-05-14 ENCOUNTER — Telehealth: Payer: Self-pay | Admitting: Family

## 2022-05-14 NOTE — Telephone Encounter (Signed)
Left message for patient to call back and schedule Medicare Annual Wellness Visit (AWV).   Please offer to do virtually or by telephone.  Left office number and my jabber [phone].  Last AWV:06/23/2017  Please schedule at anytime with Nurse Health Advisor.

## 2022-06-27 ENCOUNTER — Telehealth: Payer: Self-pay | Admitting: Family

## 2022-06-27 NOTE — Telephone Encounter (Signed)
Left message for patient to call back and schedule Medicare Annual Wellness Visit (AWV).   Please offer to do virtually or by telephone.  Left office number and my jabber [phone].  Last AWV:06/23/2017  Please schedule at anytime with Nurse Health Advisor.

## 2022-07-08 DIAGNOSIS — J018 Other acute sinusitis: Secondary | ICD-10-CM | POA: Diagnosis not present

## 2022-07-08 DIAGNOSIS — I10 Essential (primary) hypertension: Secondary | ICD-10-CM | POA: Diagnosis not present

## 2022-10-03 ENCOUNTER — Ambulatory Visit (INDEPENDENT_AMBULATORY_CARE_PROVIDER_SITE_OTHER): Payer: Medicare HMO | Admitting: Family

## 2022-10-03 ENCOUNTER — Other Ambulatory Visit (INDEPENDENT_AMBULATORY_CARE_PROVIDER_SITE_OTHER): Payer: Medicare HMO

## 2022-10-03 ENCOUNTER — Other Ambulatory Visit: Payer: Self-pay | Admitting: *Deleted

## 2022-10-03 VITALS — BP 130/74 | HR 69 | Temp 98.0°F | Resp 16 | Ht 69.0 in | Wt 155.8 lb

## 2022-10-03 DIAGNOSIS — I10 Essential (primary) hypertension: Secondary | ICD-10-CM | POA: Diagnosis not present

## 2022-10-03 DIAGNOSIS — Z13220 Encounter for screening for lipoid disorders: Secondary | ICD-10-CM | POA: Diagnosis not present

## 2022-10-03 DIAGNOSIS — Z125 Encounter for screening for malignant neoplasm of prostate: Secondary | ICD-10-CM

## 2022-10-03 DIAGNOSIS — R739 Hyperglycemia, unspecified: Secondary | ICD-10-CM

## 2022-10-03 DIAGNOSIS — Z0000 Encounter for general adult medical examination without abnormal findings: Secondary | ICD-10-CM | POA: Diagnosis not present

## 2022-10-03 LAB — CBC WITH DIFFERENTIAL/PLATELET
Basophils Absolute: 0 10*3/uL (ref 0.0–0.1)
Basophils Relative: 0.8 % (ref 0.0–3.0)
Eosinophils Absolute: 0.2 10*3/uL (ref 0.0–0.7)
Eosinophils Relative: 3.5 % (ref 0.0–5.0)
HCT: 42.3 % (ref 39.0–52.0)
Hemoglobin: 14.4 g/dL (ref 13.0–17.0)
Lymphocytes Relative: 34.6 % (ref 12.0–46.0)
Lymphs Abs: 1.6 10*3/uL (ref 0.7–4.0)
MCHC: 34 g/dL (ref 30.0–36.0)
MCV: 92.2 fl (ref 78.0–100.0)
Monocytes Absolute: 0.5 10*3/uL (ref 0.1–1.0)
Monocytes Relative: 10.3 % (ref 3.0–12.0)
Neutro Abs: 2.4 10*3/uL (ref 1.4–7.7)
Neutrophils Relative %: 50.8 % (ref 43.0–77.0)
Platelets: 286 10*3/uL (ref 150.0–400.0)
RBC: 4.59 Mil/uL (ref 4.22–5.81)
RDW: 13.3 % (ref 11.5–15.5)
WBC: 4.7 10*3/uL (ref 4.0–10.5)

## 2022-10-03 LAB — LIPID PANEL
Cholesterol: 212 mg/dL — ABNORMAL HIGH (ref 0–200)
HDL: 74.2 mg/dL (ref >39.00)
LDL Cholesterol: 125 mg/dL — ABNORMAL HIGH (ref 0–99)
NonHDL: 137.62
Total CHOL/HDL Ratio: 3
Triglycerides: 64 mg/dL (ref 0.0–149.0)
VLDL: 12.8 mg/dL (ref 0.0–40.0)

## 2022-10-03 LAB — COMPREHENSIVE METABOLIC PANEL
ALT: 17 U/L (ref 0–53)
AST: 19 U/L (ref 0–37)
Albumin: 4.6 g/dL (ref 3.5–5.2)
Alkaline Phosphatase: 54 U/L (ref 39–117)
BUN: 23 mg/dL (ref 6–23)
CO2: 29 mEq/L (ref 19–32)
Calcium: 9.6 mg/dL (ref 8.4–10.5)
Chloride: 100 mEq/L (ref 96–112)
Creatinine, Ser: 0.84 mg/dL (ref 0.40–1.50)
GFR: 86.1 mL/min (ref >60.00)
Glucose, Bld: 114 mg/dL — ABNORMAL HIGH (ref 70–99)
Potassium: 4.5 mEq/L (ref 3.5–5.1)
Sodium: 137 mEq/L (ref 135–145)
Total Bilirubin: 0.7 mg/dL (ref 0.2–1.2)
Total Protein: 6.7 g/dL (ref 6.0–8.3)

## 2022-10-03 LAB — PSA: PSA: 3.76 ng/mL (ref 0.10–4.00)

## 2022-10-03 LAB — HEMOGLOBIN A1C: Hgb A1c MFr Bld: 5.8 % (ref 4.6–6.5)

## 2022-10-03 MED ORDER — LOSARTAN POTASSIUM 100 MG PO TABS: ORAL_TABLET | ORAL | 3 refills | Status: DC

## 2022-10-03 MED ORDER — AMLODIPINE BESYLATE 10 MG PO TABS: ORAL_TABLET | ORAL | 3 refills | Status: DC

## 2022-10-03 NOTE — Progress Notes (Signed)
Travis Bell is a 77 y.o. male with the following history as recorded in EpicCare:  Patient Active Problem List   Diagnosis Date Noted   Spasm of cervical paraspinous muscle 02/26/2017   Squamous cell carcinoma 04/29/2016   Benign skin lesion of thigh 04/22/2016   Hip flexor tendon tightness 06/22/2015   Routine general medical examination at a health care facility 04/06/2015   Anemia 04/07/2013   HYPERTENSION, BENIGN ESSENTIAL 09/06/2010   COLONIC POLYPS 04/28/2008   Hyperlipidemia 04/28/2008   ALLERGIC RHINITIS 04/28/2008   DIVERTICULOSIS OF COLON 04/28/2008   LENTIGO 04/28/2008   ABNORMAL ELECTROCARDIOGRAM 04/28/2008   ANXIETY DEPRESSION 04/27/2008   HEADACHE 04/27/2008    Current Outpatient Medications  Medication Sig Dispense Refill   Calcium Carbonate-Vitamin D (CALCIUM + D PO) Take by mouth daily.     fish oil-omega-3 fatty acids 1000 MG capsule Take 2 g by mouth daily.     Multiple Vitamins-Minerals (MULTIVITAMIN PO) Take by mouth daily.     Nutritional Supplements (JOINT FORMULA PO) Take by mouth daily.     amLODipine (NORVASC) 10 MG tablet TAKE 1 TABLET BY MOUTH EVERY DAY 90 tablet 3   losartan (COZAAR) 100 MG tablet TAKE 1 TABLET BY MOUTH EVERY DAY 90 tablet 3   No current facility-administered medications for this visit.    Allergies: Patient has no known allergies.  Past Medical History:  Diagnosis Date   Allergic rhinitis    Benign neoplasm of colon    Bronchitis    Colon polyps    Diverticulosis of colon (without mention of hemorrhage)    Dysthymic disorder    Headache(784.0)    HTN (hypertension)    Lentigo    Nonspecific abnormal electrocardiogram (ECG) (EKG)    SCC (squamous cell carcinoma) 04/22/2016   RIGHT THIGH-CX3,5FU   SCC (squamous cell carcinoma) 06/06/2016   LEFT INNER SHIN-TXpBX   Sinusitis     No past surgical history on file.  Family History  Problem Relation Age of Onset   Heart disease Father    Healthy Sister    Stroke Mother      Social History   Tobacco Use   Smoking status: Former    Packs/day: 0.50    Years: 15.00    Total pack years: 7.50    Types: Cigarettes    Quit date: 09/30/1977    Years since quitting: 45.0   Smokeless tobacco: Never  Substance Use Topics   Alcohol use: Yes    Comment: 2 drinks per day    Subjective:  Presents for yearly CPE; does feel that right ear will need lavage today; continuing to run regular marathons; "doing great otherwise." Defers updating colonoscopy; decided against follow up with urology in 2023- wants to re-check levels today and re-evaluate;   Review of Systems  Constitutional: Negative.   HENT: Negative.    Eyes: Negative.   Respiratory: Negative.    Cardiovascular: Negative.   Gastrointestinal: Negative.   Genitourinary: Negative.   Musculoskeletal: Negative.   Skin: Negative.   Neurological: Negative.   Endo/Heme/Allergies: Negative.   Psychiatric/Behavioral: Negative.       Objective:  Vitals:   10/03/22 0833  BP: 130/74  Pulse: 69  Resp: 16  Temp: 98 F (36.7 C)  TempSrc: Oral  SpO2: 99%  Weight: 155 lb 12.8 oz (70.7 kg)  Height: _0  (1.753 m)    General: Well developed, well nourished, in no acute distress  Skin : Warm and dry.  Head: Normocephalic and  atraumatic  Eyes: Sclera and conjunctiva clear; pupils round and reactive to light; extraocular movements intact  Ears: External normal; after lavage, canals clear; tympanic membranes normal  Oropharynx: Pink, supple. No suspicious lesions  Neck: Supple without thyromegaly, adenopathy  Lungs: Respirations unlabored; clear to auscultation bilaterally without wheeze, rales, rhonchi  CVS exam: normal rate and regular rhythm.  Abdomen: Soft; nontender; nondistended; normoactive bowel sounds; no masses or hepatosplenomegaly  Musculoskeletal: No deformities; no active joint inflammation  Extremities: No edema, cyanosis, clubbing  Vessels: Symmetric bilaterally  Neurologic: Alert and  oriented; speech intact; face symmetrical; moves all extremities well; CNII-XII intact without focal deficit   Assessment:  1. PE (physical exam), annual   2. HYPERTENSION, BENIGN ESSENTIAL   3. Lipid screening   4. Prostate cancer screening     Plan:  Congratulated patient on commitment to health; Age appropriate preventive healthcare needs addressed; encouraged regular eye doctor and dental exams; encouraged regular exercise; will update labs and refills as needed today; follow-up to be determined; Ear lavage completed with no difficulty;   No follow-ups on file.  Orders Placed This Encounter  Procedures   CBC with Differential/Platelet   Comp Met (CMET)   Lipid panel   PSA    Requested Prescriptions   Signed Prescriptions Disp Refills   amLODipine (NORVASC) 10 MG tablet 90 tablet 3    Sig: TAKE 1 TABLET BY MOUTH EVERY DAY   losartan (COZAAR) 100 MG tablet 90 tablet 3    Sig: TAKE 1 TABLET BY MOUTH EVERY DAY

## 2022-10-11 ENCOUNTER — Encounter: Payer: Self-pay | Admitting: *Deleted

## 2022-12-20 DIAGNOSIS — R059 Cough, unspecified: Secondary | ICD-10-CM | POA: Diagnosis not present

## 2023-04-09 ENCOUNTER — Encounter: Payer: Self-pay | Admitting: Family

## 2023-04-09 DIAGNOSIS — Z1211 Encounter for screening for malignant neoplasm of colon: Secondary | ICD-10-CM

## 2023-04-29 DIAGNOSIS — Z1211 Encounter for screening for malignant neoplasm of colon: Secondary | ICD-10-CM | POA: Diagnosis not present

## 2023-07-18 DIAGNOSIS — R059 Cough, unspecified: Secondary | ICD-10-CM | POA: Diagnosis not present

## 2023-09-25 ENCOUNTER — Other Ambulatory Visit: Payer: Self-pay | Admitting: Family

## 2023-09-25 DIAGNOSIS — I10 Essential (primary) hypertension: Secondary | ICD-10-CM

## 2023-10-14 ENCOUNTER — Encounter: Payer: Self-pay | Admitting: Family

## 2023-10-14 ENCOUNTER — Ambulatory Visit (INDEPENDENT_AMBULATORY_CARE_PROVIDER_SITE_OTHER): Payer: Medicare HMO | Admitting: Family

## 2023-10-14 VITALS — BP 136/82 | HR 76 | Ht 69.0 in | Wt 158.6 lb

## 2023-10-14 DIAGNOSIS — Z13220 Encounter for screening for lipoid disorders: Secondary | ICD-10-CM

## 2023-10-14 DIAGNOSIS — I10 Essential (primary) hypertension: Secondary | ICD-10-CM | POA: Diagnosis not present

## 2023-10-14 DIAGNOSIS — Z125 Encounter for screening for malignant neoplasm of prostate: Secondary | ICD-10-CM | POA: Diagnosis not present

## 2023-10-14 DIAGNOSIS — R7309 Other abnormal glucose: Secondary | ICD-10-CM

## 2023-10-14 DIAGNOSIS — Z0000 Encounter for general adult medical examination without abnormal findings: Secondary | ICD-10-CM

## 2023-10-14 MED ORDER — AMLODIPINE BESYLATE 10 MG PO TABS: ORAL_TABLET | ORAL | 3 refills | Status: DC

## 2023-10-14 MED ORDER — AZITHROMYCIN 250 MG PO TABS: ORAL_TABLET | ORAL | 0 refills | Status: AC

## 2023-10-14 MED ORDER — TRAZODONE HCL 50 MG PO TABS: 25.0000 mg | ORAL_TABLET | Freq: Every evening | ORAL | 1 refills | Status: DC | PRN

## 2023-10-14 MED ORDER — LOSARTAN POTASSIUM 100 MG PO TABS: 100.0000 mg | ORAL_TABLET | Freq: Every day | ORAL | 3 refills | Status: DC

## 2023-10-14 NOTE — Progress Notes (Signed)
 Travis Bell is a 78 y.o. male with the following history as recorded in EpicCare:  Patient Active Problem List   Diagnosis Date Noted   Spasm of cervical paraspinous muscle 02/26/2017   Squamous cell carcinoma 04/29/2016   Benign skin lesion of thigh 04/22/2016   Hip flexor tendon tightness 06/22/2015   Routine general medical examination at a health care facility 04/06/2015   Anemia 04/07/2013   HYPERTENSION, BENIGN ESSENTIAL 09/06/2010   COLONIC POLYPS 04/28/2008   Hyperlipidemia 04/28/2008   Allergic rhinitis 04/28/2008   Diverticulosis of colon 04/28/2008   LENTIGO 04/28/2008   ABNORMAL ELECTROCARDIOGRAM 04/28/2008   ANXIETY DEPRESSION 04/27/2008   Headache 04/27/2008    Current Outpatient Medications  Medication Sig Dispense Refill   azithromycin  (ZITHROMAX ) 250 MG tablet Take 2 tablets on day 1, then 1 tablet daily on days 2 through 5 6 tablet 0   Calcium Carbonate-Vitamin D  (CALCIUM + D PO) Take by mouth daily.     fish oil-omega-3 fatty acids 1000 MG capsule Take 2 g by mouth daily.     Multiple Vitamins-Minerals (MULTIVITAMIN PO) Take by mouth daily.     Nutritional Supplements (JOINT FORMULA PO) Take by mouth daily.     traZODone  (DESYREL ) 50 MG tablet Take 0.5-1 tablets (25-50 mg total) by mouth at bedtime as needed for sleep. 30 tablet 1   amLODipine  (NORVASC ) 10 MG tablet TAKE 1 TABLET BY MOUTH EVERY DAY 90 tablet 3   losartan  (COZAAR ) 100 MG tablet Take 1 tablet (100 mg total) by mouth daily. 90 tablet 3   No current facility-administered medications for this visit.    Allergies: Patient has no known allergies.  Past Medical History:  Diagnosis Date   Allergic rhinitis    Benign neoplasm of colon    Bronchitis    Colon polyps    Diverticulosis of colon (without mention of hemorrhage)    Dysthymic disorder    Headache(784.0)    HTN (hypertension)    Lentigo    Nonspecific abnormal electrocardiogram (ECG) (EKG)    SCC (squamous cell carcinoma) 04/22/2016    RIGHT THIGH-CX3,5FU   SCC (squamous cell carcinoma) 06/06/2016   LEFT INNER SHIN-TXpBX   Sinusitis     No past surgical history on file.  Family History  Problem Relation Age of Onset   Heart disease Father    Healthy Sister    Stroke Mother     Social History   Tobacco Use   Smoking status: Former    Current packs/day: 0.00    Average packs/day: 0.5 packs/day for 15.0 years (7.5 ttl pk-yrs)    Types: Cigarettes    Start date: 09/30/1962    Quit date: 09/30/1977    Years since quitting: 46.0   Smokeless tobacco: Never  Substance Use Topics   Alcohol use: Yes    Comment: 2 drinks per day    Subjective:   Patient presents for yearly CPE; no acute concerns today;  Ran the [employer] in November- had COVID 1 week prior and notes it was a very difficult race; notes that has been battling chronic congestion and is concerned that now has secondary infection- requesting Z-pak;   Review of Systems  Constitutional: Negative.   HENT: Negative.    Eyes: Negative.   Respiratory: Negative.    Cardiovascular: Negative.   Gastrointestinal: Negative.   Genitourinary: Negative.   Musculoskeletal: Negative.   Skin: Negative.   Neurological: Negative.   Endo/Heme/Allergies: Negative.   Psychiatric/Behavioral: Negative.  Objective:  Vitals:   10/14/23 0822  BP: 136/82  Pulse: 76  SpO2: 97%  Weight: 158 lb 9.6 oz (71.9 kg)  Height: 5' 9 (1.753 m)    General: Well developed, well nourished, in no acute distress  Skin : Warm and dry.  Head: Normocephalic and atraumatic  Eyes: Sclera and conjunctiva clear; pupils round and reactive to light; extraocular movements intact  Ears: External normal; canals clear; tympanic membranes normal  Oropharynx: Pink, supple. No suspicious lesions  Neck: Supple without thyromegaly, adenopathy  Lungs: Respirations unlabored; clear to auscultation bilaterally without wheeze, rales, rhonchi  CVS exam: normal rate and regular  rhythm.  Abdomen: Soft; nontender; nondistended; normoactive bowel sounds; no masses or hepatosplenomegaly  Musculoskeletal: No deformities; no active joint inflammation  Extremities: No edema, cyanosis, clubbing  Vessels: Symmetric bilaterally  Neurologic: Alert and oriented; speech intact; face symmetrical; moves all extremities well; CNII-XII intact without focal deficit   Assessment:  1. PE (physical exam), annual   2. Lipid screening   3. Elevated glucose   4. Prostate cancer screening   5. HYPERTENSION, BENIGN ESSENTIAL     Plan:  Congratulated patient on commitment to his health; Age appropriate preventive healthcare needs addressed; encouraged regular eye doctor and dental exams; encouraged regular exercise; will update refills as needed today; he will go to [facility] to get fasting labs tomorrow at his convenience; Trial of Trazodone  to use as needed to help with recurrent insomnia; Rx for Z-pak given per patient request to treat suspected infection;  Follow-up in 1 year, sooner prn.     No follow-ups on file.  Orders Placed This Encounter  Procedures   CBC with Differential/Platelet    Standing Status:   Future    Expiration Date:   10/13/2024   Comp Met (CMET)    Standing Status:   Future    Expiration Date:   10/13/2024   Lipid panel    Standing Status:   Future    Expiration Date:   10/13/2024   PSA    Standing Status:   Future    Expiration Date:   10/13/2024   Hemoglobin A1c    Standing Status:   Future    Expiration Date:   10/13/2024    Requested Prescriptions   Signed Prescriptions Disp Refills   azithromycin  (ZITHROMAX ) 250 MG tablet 6 tablet 0    Sig: Take 2 tablets on day 1, then 1 tablet daily on days 2 through 5   losartan  (COZAAR ) 100 MG tablet 90 tablet 3    Sig: Take 1 tablet (100 mg total) by mouth daily.   amLODipine  (NORVASC ) 10 MG tablet 90 tablet 3    Sig: TAKE 1 TABLET BY MOUTH EVERY DAY   traZODone  (DESYREL ) 50 MG tablet 30 tablet 1    Sig:  Take 0.5-1 tablets (25-50 mg total) by mouth at bedtime as needed for sleep.

## 2023-10-16 ENCOUNTER — Other Ambulatory Visit: Payer: Self-pay | Admitting: Family

## 2023-10-16 ENCOUNTER — Other Ambulatory Visit: Payer: Medicare HMO

## 2023-10-16 DIAGNOSIS — Z0000 Encounter for general adult medical examination without abnormal findings: Secondary | ICD-10-CM | POA: Diagnosis not present

## 2023-10-16 DIAGNOSIS — Z125 Encounter for screening for malignant neoplasm of prostate: Secondary | ICD-10-CM | POA: Diagnosis not present

## 2023-10-16 DIAGNOSIS — R7309 Other abnormal glucose: Secondary | ICD-10-CM | POA: Diagnosis not present

## 2023-10-16 DIAGNOSIS — Z13220 Encounter for screening for lipoid disorders: Secondary | ICD-10-CM

## 2023-10-16 DIAGNOSIS — R972 Elevated prostate specific antigen [PSA]: Secondary | ICD-10-CM

## 2023-10-16 LAB — CBC WITH DIFFERENTIAL/PLATELET
Basophils Absolute: 0 10*3/uL (ref 0.0–0.1)
Basophils Relative: 0.8 % (ref 0.0–3.0)
Eosinophils Absolute: 0.1 10*3/uL (ref 0.0–0.7)
Eosinophils Relative: 2.2 % (ref 0.0–5.0)
HCT: 42.1 % (ref 39.0–52.0)
Hemoglobin: 14.3 g/dL (ref 13.0–17.0)
Lymphocytes Relative: 37.1 % (ref 12.0–46.0)
Lymphs Abs: 1.8 10*3/uL (ref 0.7–4.0)
MCHC: 33.9 g/dL (ref 30.0–36.0)
MCV: 94.2 fL (ref 78.0–100.0)
Monocytes Absolute: 0.5 10*3/uL (ref 0.1–1.0)
Monocytes Relative: 10.2 % (ref 3.0–12.0)
Neutro Abs: 2.5 10*3/uL (ref 1.4–7.7)
Neutrophils Relative %: 49.7 % (ref 43.0–77.0)
Platelets: 334 10*3/uL (ref 150.0–400.0)
RBC: 4.47 Mil/uL (ref 4.22–5.81)
RDW: 13.2 % (ref 11.5–15.5)
WBC: 4.9 10*3/uL (ref 4.0–10.5)

## 2023-10-16 LAB — LIPID PANEL
Cholesterol: 197 mg/dL (ref 0–200)
HDL: 74.7 mg/dL (ref >39.00)
LDL Cholesterol: 114 mg/dL — ABNORMAL HIGH (ref 0–99)
NonHDL: 122.76
Total CHOL/HDL Ratio: 3
Triglycerides: 42 mg/dL (ref 0.0–149.0)
VLDL: 8.4 mg/dL (ref 0.0–40.0)

## 2023-10-16 LAB — COMPREHENSIVE METABOLIC PANEL
ALT: 21 U/L (ref 0–53)
AST: 22 U/L (ref 0–37)
Albumin: 4.6 g/dL (ref 3.5–5.2)
Alkaline Phosphatase: 53 U/L (ref 39–117)
BUN: 23 mg/dL (ref 6–23)
CO2: 26 meq/L (ref 19–32)
Calcium: 9.2 mg/dL (ref 8.4–10.5)
Chloride: 101 meq/L (ref 96–112)
Creatinine, Ser: 0.74 mg/dL (ref 0.40–1.50)
GFR: 88.81 mL/min (ref >60.00)
Glucose, Bld: 116 mg/dL — ABNORMAL HIGH (ref 70–99)
Potassium: 4.3 meq/L (ref 3.5–5.1)
Sodium: 137 meq/L (ref 135–145)
Total Bilirubin: 0.8 mg/dL (ref 0.2–1.2)
Total Protein: 6.6 g/dL (ref 6.0–8.3)

## 2023-10-16 LAB — PSA: PSA: 4.9 ng/mL — ABNORMAL HIGH (ref 0.10–4.00)

## 2023-10-16 LAB — HEMOGLOBIN A1C: Hgb A1c MFr Bld: 6 % (ref 4.6–6.5)

## 2023-10-17 ENCOUNTER — Encounter: Payer: Self-pay | Admitting: Family

## 2023-11-11 ENCOUNTER — Encounter: Payer: Self-pay | Admitting: Urology

## 2023-11-11 ENCOUNTER — Ambulatory Visit (INDEPENDENT_AMBULATORY_CARE_PROVIDER_SITE_OTHER): Payer: Medicare HMO | Admitting: Urology

## 2023-11-11 VITALS — BP 148/85 | HR 77 | Ht 69.0 in | Wt 158.0 lb

## 2023-11-11 DIAGNOSIS — R972 Elevated prostate specific antigen [PSA]: Secondary | ICD-10-CM | POA: Insufficient documentation

## 2023-11-11 DIAGNOSIS — N400 Enlarged prostate without lower urinary tract symptoms: Secondary | ICD-10-CM | POA: Insufficient documentation

## 2023-11-11 LAB — URINALYSIS, ROUTINE W REFLEX MICROSCOPIC
Bilirubin, UA: NEGATIVE
Glucose, UA: NEGATIVE
Ketones, UA: NEGATIVE
Leukocytes,UA: NEGATIVE
Nitrite, UA: NEGATIVE
Protein,UA: NEGATIVE
RBC, UA: NEGATIVE
Specific Gravity, UA: 1.015 (ref 1.005–1.030)
Urobilinogen, Ur: 0.2 mg/dL (ref 0.2–1.0)
pH, UA: 6.5 (ref 5.0–7.5)

## 2023-11-11 NOTE — Progress Notes (Addendum)
Assessment: 1. Elevated PSA   2. BPH without urinary obstruction     Plan: I personally reviewed the patient's chart including provider notes, and lab results. Today I had a long discussion with the patient regarding PSA and the rationale and controversies of prostate cancer early detection.  I discussed the pros and cons of further evaluation including TRUS and prostate Bx.  Potential adverse events and complications as well as standard instructions were given.  Patient expressed his understanding of these issues. 4K test ordered today.  Will call with results and recommendations.  Chief Complaint:  Chief Complaint  Patient presents with   Elevated PSA    History of Present Illness:  Travis Bell is a 78 y.o. male who is seen in consultation from Olive Bass, FNP for evaluation of elevated PSA.  PSA results: 11/20 2.76 11/21 3.21 1/23 4.07 1/24 3.76 1/25 4.90  No prior prostate biopsy.  No history of prostatitis or UTIs.  No family history of prostate cancer. No lower urinary tract symptoms other than occasional nocturia.  No dysuria or gross hematuria. IPSS = 2 today.  He is very healthy and is a avid runner.  Past Medical History:  Past Medical History:  Diagnosis Date   Allergic rhinitis    Benign neoplasm of colon    Bronchitis    Colon polyps    Diverticulosis of colon (without mention of hemorrhage)    Dysthymic disorder    Headache(784.0)    HTN (hypertension)    Lentigo    Nonspecific abnormal electrocardiogram (ECG) (EKG)    SCC (squamous cell carcinoma) 04/22/2016   RIGHT THIGH-CX3,5FU   SCC (squamous cell carcinoma) 06/06/2016   LEFT INNER SHIN-TXpBX   Sinusitis     Past Surgical History:  No past surgical history on file.  Allergies:  No Known Allergies  Family History:  Family History  Problem Relation Age of Onset   Heart disease Father    Healthy Sister    Stroke Mother     Social History:  Social History   Tobacco  Use   Smoking status: Former    Current packs/day: 0.00    Average packs/day: 0.5 packs/day for 15.0 years (7.5 ttl pk-yrs)    Types: Cigarettes    Start date: 09/30/1962    Quit date: 09/30/1977    Years since quitting: 46.1   Smokeless tobacco: Never  Vaping Use   Vaping status: Never Used  Substance Use Topics   Alcohol use: Yes    Comment: 2 drinks per day   Drug use: No    Review of symptoms:  Constitutional:  Negative for unexplained weight loss, night sweats, fever, chills ENT:  Negative for nose bleeds, sinus pain, painful swallowing CV:  Negative for chest pain, shortness of breath, exercise intolerance, palpitations, loss of consciousness Resp:  Negative for cough, wheezing, shortness of breath GI:  Negative for nausea, vomiting, diarrhea, bloody stools GU:  Positives noted in HPI; otherwise negative for gross hematuria, dysuria, urinary incontinence Neuro:  Negative for seizures, poor balance, limb weakness, slurred speech Psych:  Negative for lack of energy, depression, anxiety Endocrine:  Negative for polydipsia, polyuria, symptoms of hypoglycemia (dizziness, hunger, sweating) Hematologic:  Negative for anemia, purpura, petechia, prolonged or excessive bleeding, use of anticoagulants  Allergic:  Negative for difficulty breathing or choking as a result of exposure to anything; no shellfish allergy; no allergic response (rash/itch) to materials, foods  Physical exam: BP (!) 148/85   Pulse 77  Ht 5\' 9"  (1.753 m)   Wt 158 lb (71.7 kg)   BMI 23.33 kg/m  GENERAL APPEARANCE:  Well appearing, well developed, well nourished, NAD HEENT: Atraumatic, Normocephalic, oropharynx clear. NECK: Supple without lymphadenopathy or thyromegaly. LUNGS: Clear to auscultation bilaterally. HEART: Regular Rate and Rhythm without murmurs, gallops, or rubs. ABDOMEN: Soft, non-tender, No Masses. EXTREMITIES: Moves all extremities well.  Without clubbing, cyanosis, or edema. NEUROLOGIC:  Alert  and oriented x 3, normal gait, CN II-XII grossly intact.  MENTAL STATUS:  Appropriate. BACK:  Non-tender to palpation.  No CVAT SKIN:  Warm, dry and intact.   GU: Penis:  circumcised Meatus: Normal Scrotum: normal, no masses Testis: normal without masses bilateral Prostate: 60 g, nontender, no nodules Rectum: Normal tone,  no masses or tenderness   Results: U/A: negative

## 2023-11-13 ENCOUNTER — Encounter: Payer: Self-pay | Admitting: Urology

## 2023-11-14 ENCOUNTER — Encounter: Payer: Self-pay | Admitting: Urology

## 2023-11-17 ENCOUNTER — Telehealth: Payer: Self-pay | Admitting: Urology

## 2023-11-17 NOTE — Telephone Encounter (Signed)
Called and left voice mail for pt to call back and schedule apt with Dr Pete Glatter in 08/25.

## 2023-12-16 ENCOUNTER — Other Ambulatory Visit: Payer: Self-pay | Admitting: Family

## 2023-12-22 DIAGNOSIS — M25552 Pain in left hip: Secondary | ICD-10-CM | POA: Diagnosis not present

## 2023-12-29 NOTE — Progress Notes (Unsigned)
   Rubin Payor, PhD, LAT, ATC acting as a scribe for Clementeen Graham, MD.  Aloha Gell Kozlov is a 78 y.o. male who presents to [facility] at [facility] today for cont'd L hip pain. Pt was last seen by Dr. Denyse Amass on 08/08/21 for this issue and was taught HEP and prescribed prednisone.  Today, pt c/o cont'd L hip pain x a couple months. He continues to run 1/2   marathons and other races. Last Thursday, after a short run, his L hip was really painful. He is scheduled to run a 1/2 marathon next weekend. Pt locates pain to the lateral aspect of his L hip.  Treatments tried: prior PT, IBU, stretching  Pertinent review of systems: No fevers or chills  Relevant historical information: Hypertension and BPH.  Patient does have an established relationship at [facility] PT.   Exam:  BP (!) 144/86   Pulse 74   Ht 5\' 9"  (1.753 m)   Wt 157 lb (71.2 kg)   SpO2 99%   BMI 23.18 kg/m  General: Well Developed, well nourished, and in no acute distress.   MSK: Left hip bruising present left lateral to anterior hip.  Tender palpation greater trochanter.  Hip abduction and external rotation strength are mildly diminished. Normal gait.    Lab and Radiology Results  Procedure: Real-time Ultrasound Guided Injection of left lateral hip greater trochanter bursa Device: Philips Affiniti 50G/GE Logiq Images permanently stored and available for review in PACS Verbal informed consent obtained.  Discussed risks and benefits of procedure. Warned about infection, bleeding, hyperglycemia damage to structures among others. Patient expresses understanding and agreement Time-out conducted.   Noted no overlying erythema, induration, or other signs of local infection.   Skin prepped in a sterile fashion.   Local anesthesia: Topical Ethyl chloride.   With sterile technique and under real time ultrasound guidance: 40 mg of Kenalog and 2 mL of Marcaine injected into greater trochanter bursa. Fluid seen  entering the bursa.   Completed without difficulty   Pain immediately resolved suggesting accurate placement of the medication.   Advised to call if fevers/chills, erythema, induration, drainage, or persistent bleeding.   Images permanently stored and available for review in the ultrasound unit.  Impression: Technically successful ultrasound guided injection.         Assessment and Plan: 78 y.o. male with left lateral hip pain.  Patient does have bruising suggesting an injury.  He denies any falls.  Suspect hip abductor tendon strain or partial tear.  Plan for steroid injection today and continued home exercise program.  Okay to refer back to [facility] if needed in the future.  Cautioned against running if his pain is so bad it is making him limp.   PDMP not reviewed this encounter. Orders Placed This Encounter  Procedures   [place] LIMITED JOINT SPACE STRUCTURES LOW LEFT(NO LINKED CHARGES)    Reason for Exam (SYMPTOM  OR DIAGNOSIS REQUIRED):   left hip pain    Preferred imaging location?:   [facility]   No orders of the defined types were placed in this encounter.    Discussed warning signs or symptoms. Please see discharge instructions. Patient expresses understanding.   The above documentation has been reviewed and is accurate and complete Clementeen Graham, M.D.

## 2023-12-30 ENCOUNTER — Other Ambulatory Visit: Payer: Self-pay

## 2023-12-30 ENCOUNTER — Ambulatory Visit: Admitting: Family Medicine

## 2023-12-30 VITALS — BP 144/86 | HR 74 | Ht 69.0 in | Wt 157.0 lb

## 2023-12-30 DIAGNOSIS — M25552 Pain in left hip: Secondary | ICD-10-CM | POA: Diagnosis not present

## 2023-12-30 NOTE — Patient Instructions (Addendum)
 Thank you for coming in today.   Please use Voltaren gel (Generic Diclofenac Gel) up to 4x daily for pain as needed.  This is available over-the-counter as both the name brand Voltaren gel and the generic diclofenac gel.   Please work on the home exercises the athletic trainer went over with you:  View at [email or web address] using code [email or web address]  Check back as needed!  Good luck on your upcoming race!

## 2024-05-24 ENCOUNTER — Ambulatory Visit: Payer: Medicare HMO | Admitting: Urology

## 2024-05-24 ENCOUNTER — Encounter: Payer: Self-pay | Admitting: Urology

## 2024-05-24 VITALS — BP 136/83 | HR 71 | Ht 69.0 in | Wt 160.0 lb

## 2024-05-24 DIAGNOSIS — R972 Elevated prostate specific antigen [PSA]: Secondary | ICD-10-CM | POA: Diagnosis not present

## 2024-05-24 DIAGNOSIS — N400 Enlarged prostate without lower urinary tract symptoms: Secondary | ICD-10-CM

## 2024-05-24 LAB — URINALYSIS, ROUTINE W REFLEX MICROSCOPIC
Bilirubin, UA: NEGATIVE
Glucose, UA: NEGATIVE
Ketones, UA: NEGATIVE
Leukocytes,UA: NEGATIVE
Nitrite, UA: NEGATIVE
Protein,UA: NEGATIVE
RBC, UA: NEGATIVE
Specific Gravity, UA: 1.01 (ref 1.005–1.030)
Urobilinogen, Ur: 0.2 mg/dL (ref 0.2–1.0)
pH, UA: 6.5 (ref 5.0–7.5)

## 2024-05-24 NOTE — Progress Notes (Addendum)
   Assessment: 1. Elevated PSA   2. BPH without urinary obstruction     Plan: PHI sent today -will contact him with results Return to office in 6 months.  Chief Complaint:  Chief Complaint  Patient presents with   Elevated PSA    History of Present Illness:  Travis Bell is a 78 y.o. male who is seen for further evaluation of elevated PSA.  PSA results: 11/20 2.76 11/21 3.21 1/23 4.07 1/24 3.76 1/25 4.90  No prior prostate biopsy.  No history of prostatitis or UTIs.  No family history of prostate cancer. No lower urinary tract symptoms other than occasional nocturia.  No dysuria or gross hematuria. IPSS = 2. 4K test from 2/25:  PSA 3.77, free PSA 23%, 4K score 11.7 (intermediate risk for aggressive prostate cancer on biopsy)  He is very healthy and is a avid runner.   He returns today for follow-up.  No new lower urinary tract symptoms.  No dysuria or gross hematuria. IPSS = 3/0.  Portions of the above documentation were copied from a prior visit for review purposes only.   Past Medical History:  Past Medical History:  Diagnosis Date   Allergic rhinitis    Benign neoplasm of colon    Bronchitis    Colon polyps    Diverticulosis of colon (without mention of hemorrhage)    Dysthymic disorder    Headache(784.0)    HTN (hypertension)    Lentigo    Nonspecific abnormal electrocardiogram (ECG) (EKG)    SCC (squamous cell carcinoma) 04/22/2016   RIGHT THIGH-CX3,5FU   SCC (squamous cell carcinoma) 06/06/2016   LEFT INNER SHIN-TXpBX   Sinusitis     Past Surgical History:  No past surgical history on file.  Allergies:  No Known Allergies  Family History:  Family History  Problem Relation Age of Onset   Heart disease Father    Healthy Sister    Stroke Mother     Social History:  Social History   Tobacco Use   Smoking status: Former    Current packs/day: 0.00    Average packs/day: 0.5 packs/day for 15.0 years (7.5 ttl pk-yrs)    Types: Cigarettes     Start date: 09/30/1962    Quit date: 09/30/1977    Years since quitting: 46.6   Smokeless tobacco: Never  Vaping Use   Vaping status: Never Used  Substance Use Topics   Alcohol use: Yes    Comment: 2 drinks per day   Drug use: No    ROS: Constitutional:  Negative for fever, chills, weight loss CV: Negative for chest pain, previous MI, hypertension Respiratory:  Negative for shortness of breath, wheezing, sleep apnea, frequent cough GI:  Negative for nausea, vomiting, bloody stool, GERD  Physical exam: BP 136/83   Pulse 71   Ht 5' 9 (1.753 m)   Wt 160 lb (72.6 kg)   BMI 23.63 kg/m  GENERAL APPEARANCE:  Well appearing, well developed, well nourished, NAD HEENT:  Atraumatic, normocephalic, oropharynx clear NECK:  Supple without lymphadenopathy or thyromegaly ABDOMEN:  Soft, non-tender, no masses EXTREMITIES:  Moves all extremities well, without clubbing, cyanosis, or edema NEUROLOGIC:  Alert and oriented x 3, normal gait, CN II-XII grossly intact MENTAL STATUS:  appropriate BACK:  Non-tender to palpation, No CVAT SKIN:  Warm, dry, and intact   Results: U/A:  negative

## 2024-05-26 ENCOUNTER — Ambulatory Visit: Payer: Self-pay | Admitting: Urology

## 2024-05-26 LAB — PHI SCORE REFLEX
% Free PSA: 15 %
PSA, Free: 0.74 ng/mL
Prostate Heath Index Score: 21.7
p2PSA: 7.2 pg/mL

## 2024-05-26 LAB — PROSTATE HEALTH INDEX: Prostate Specific Ag: 4.9 ng/mL — ABNORMAL HIGH (ref 0.0–3.9)

## 2024-06-20 ENCOUNTER — Other Ambulatory Visit: Payer: Self-pay | Admitting: Family

## 2024-06-20 DIAGNOSIS — I10 Essential (primary) hypertension: Secondary | ICD-10-CM

## 2024-09-09 ENCOUNTER — Other Ambulatory Visit: Payer: Self-pay

## 2024-09-09 ENCOUNTER — Ambulatory Visit: Admitting: Family Medicine

## 2024-09-09 VITALS — BP 138/80 | HR 65 | Ht 69.0 in | Wt 152.0 lb

## 2024-09-09 DIAGNOSIS — M25552 Pain in left hip: Secondary | ICD-10-CM

## 2024-09-09 NOTE — Progress Notes (Signed)
° °  I, Claretha Schimke am a scribe for Dr. Artist Lloyd, MD.  Norleen SQUIBB Nidiffer is a 78 y.o. male who presents to [facility] at [facility] today for exacerbation of left hip pain. Pt was last seen by Dr. Lloyd on 12/30/23 for left hip pain. Pt received greater trochanter bursa injection, advised to try tropical Voltaren Gel, and was provided with HEP.   Today, patient reports that his hip hurts and would like another injection.   Diagnostic Imaging  09/15/15 - XR L-Spine   Pertinent review of systems: No fevers or chills  Relevant historical information: Hypertension   Exam:  BP 138/80   Pulse 65   Ht 5' 9 (1.753 m)   Wt 152 lb (68.9 kg)   SpO2 100%   BMI 22.45 kg/m  General: Well Developed, well nourished, and in no acute distress.   MSK: Left hip: Normal-appearing Tender palpation greater trochanter.  Hip abduction strength is intact.    Lab and Radiology Results  Procedure: Real-time Ultrasound Guided Injection of the left lateral hip greater trochanter bursa Device: Philips Affiniti 50G/GE Logiq Images permanently stored and available for review in PACS Verbal informed consent obtained.  Discussed risks and benefits of procedure. Warned about infection, bleeding, hyperglycemia damage to structures among others. Patient expresses understanding and agreement Time-out conducted.   Noted no overlying erythema, induration, or other signs of local infection.   Skin prepped in a sterile fashion.   Local anesthesia: Topical Ethyl chloride.   With sterile technique and under real time ultrasound guidance: 40 mg of Kenalog and 2 mL of Marcaine injected into greater trochanter bursa. Fluid seen entering the bursa.   Completed without difficulty   Pain immediately resolved suggesting accurate placement of the medication.   Advised to call if fevers/chills, erythema, induration, drainage, or persistent bleeding.   Images permanently stored and available for review in  the ultrasound unit.  Impression: Technically successful ultrasound guided injection.       Assessment and Plan: 78 y.o. male with left lateral hip pain due to greater trochanteric bursitis.  Plan for greater trochanter injection and continued home exercise program.  Check back as needed.  He has upcoming marathons.  Okay to prescribe prednisone  in the future if needed. PDMP not reviewed this encounter. Orders Placed This Encounter  Procedures   [place]  LIMITED JOINT SPACE STRUCTURES LOW LEFT(NO LINKED CHARGES)    Reason for Exam (SYMPTOM  OR DIAGNOSIS REQUIRED):   left hip pain    Preferred imaging location?:   [facility]   No orders of the defined types were placed in this encounter.    Discussed warning signs or symptoms. Please see discharge instructions. Patient expresses understanding.   The above documentation has been reviewed and is accurate and complete Artist Lloyd, M.D.

## 2024-09-09 NOTE — Patient Instructions (Addendum)
Thank you for coming in today.   You received an injection today. Seek immediate medical attention if the joint becomes red, extremely painful, or is oozing fluid.   Return as needed.

## 2024-09-13 ENCOUNTER — Encounter: Payer: Self-pay | Admitting: Family Medicine

## 2024-09-13 NOTE — Telephone Encounter (Signed)
 Forwarding to Dr. Denyse Amass to review and advise.

## 2024-10-19 NOTE — Progress Notes (Addendum)
"       ° °  LILLETTE Ileana Collet, PhD, LAT, ATC acting as a scribe for Artist Lloyd, MD.  Travis Bell is a 79 y.o. male who presents to [facility] at [facility] today for cont'd L hip pain. Pt was last seen by Dr. Lloyd on 09/09/24 and was given a L lateral hip steroid injection.  Pt sent a f/u MyChart message on Dec 15th noting that the injection was not in the right spot and didn't provide relief.  Today, pt reports pain is more superior than where the injection was given. He's cont to run and exercise. He's been taking IBU. Has some upcoming races.  Pertinent review of systems: No fevers or chills  Relevant historical information: BPH   Exam:  BP 138/80   Pulse 65   Ht 5' 9 (1.753 m)   Wt 152 lb (68.9 kg)   SpO2 98%   BMI 22.45 kg/m  General: Well Developed, well nourished, and in no acute distress.   MSK: Left hip normal appearing. Tender palpation lateral hip near iliac crest.    Lab and Radiology Results  Procedure: Real-time Ultrasound Guided Injection of left lateral hip near iliac crest area of tenderness (tendon origin) Device: Philips Affiniti 50G/GE Logiq Images permanently stored and available for review in PACS Verbal informed consent obtained.  Discussed risks and benefits of procedure. Warned about infection, bleeding, hyperglycemia damage to structures among others. Patient expresses understanding and agreement Time-out conducted.   Noted no overlying erythema, induration, or other signs of local infection.   Skin prepped in a sterile fashion.   Local anesthesia: Topical Ethyl chloride.   With sterile technique and under real time ultrasound guidance: 40 mg of Kenalog and 2 mL of Marcaine injected into the iliac crest crest tendon origin area. Fluid seen entering the iliac crest bursa area.   Completed without difficulty   Pain immediately resolved suggesting accurate placement of the medication.   Advised to call if fevers/chills, erythema,  induration, drainage, or persistent bleeding.   Images permanently stored and available for review in the ultrasound unit.  Impression: Technically successful ultrasound guided injection.         Assessment and Plan: 79 y.o. male with left lateral hip pain.  Difficult to pin down.  Proceed with injection targeted to the area of tenderness near the iliac crest.  If not improved with this consider iontophoresis with PT.  Also we could proceed with an MRI. The injection was performed at the iliac crest tendon origin area not bursa or joint capsule.  PDMP not reviewed this encounter. Orders Placed This Encounter  Procedures   US  LIMITED JOINT SPACE STRUCTURES LOW LEFT(NO LINKED CHARGES)    Reason for Exam (SYMPTOM  OR DIAGNOSIS REQUIRED):   left hip pain    Preferred imaging location?:   [facility]   No orders of the defined types were placed in this encounter.    Discussed warning signs or symptoms. Please see discharge instructions. Patient expresses understanding.   The above documentation has been reviewed and is accurate and complete Artist Lloyd, M.D.   "

## 2024-10-20 ENCOUNTER — Ambulatory Visit: Payer: Self-pay

## 2024-10-20 ENCOUNTER — Ambulatory Visit: Admitting: Family Medicine

## 2024-10-20 ENCOUNTER — Ambulatory Visit (INDEPENDENT_AMBULATORY_CARE_PROVIDER_SITE_OTHER): Admitting: Physician Assistant

## 2024-10-20 ENCOUNTER — Encounter: Payer: Self-pay | Admitting: Physician Assistant

## 2024-10-20 VITALS — BP 130/78 | HR 82 | Temp 97.8°F | Resp 17 | Ht 69.0 in | Wt 153.2 lb

## 2024-10-20 VITALS — BP 138/80 | HR 65 | Ht 69.0 in | Wt 152.0 lb

## 2024-10-20 DIAGNOSIS — K4090 Unilateral inguinal hernia, without obstruction or gangrene, not specified as recurrent: Secondary | ICD-10-CM | POA: Insufficient documentation

## 2024-10-20 DIAGNOSIS — M25552 Pain in left hip: Secondary | ICD-10-CM | POA: Diagnosis not present

## 2024-10-20 DIAGNOSIS — C4492 Squamous cell carcinoma of skin, unspecified: Secondary | ICD-10-CM | POA: Insufficient documentation

## 2024-10-20 DIAGNOSIS — N400 Enlarged prostate without lower urinary tract symptoms: Secondary | ICD-10-CM

## 2024-10-20 DIAGNOSIS — D0472 Carcinoma in situ of skin of left lower limb, including hip: Secondary | ICD-10-CM

## 2024-10-20 DIAGNOSIS — I10 Essential (primary) hypertension: Secondary | ICD-10-CM

## 2024-10-20 MED ORDER — AMLODIPINE BESYLATE 10 MG PO TABS: ORAL_TABLET | ORAL | 3 refills | Status: AC

## 2024-10-20 MED ORDER — LOSARTAN POTASSIUM 100 MG PO TABS: 100.0000 mg | ORAL_TABLET | Freq: Every day | ORAL | 3 refills | Status: AC

## 2024-10-20 NOTE — Patient Instructions (Signed)
" °  VISIT SUMMARY: During your visit today, we conducted your annual physical exam and followed up on your recent cortisone injection for left hip pain. We also discussed your blood pressure, inguinal hernia, benign prostatic hyperplasia, and history of squamous cell carcinoma.  YOUR PLAN: -BLOOD PRESSURE: Your blood pressure was slightly elevated today, likely due to the recent cortisone injection and ibuprofen use. We will continue with your current medications, losartan  and amlodipine , and have refilled your prescriptions. We will monitor your blood pressure closely.  -RIGHT INGUINAL HERNIA: You have a right inguinal hernia, which is a bulge in the groin area. It has been present for two months without pain or discomfort. We discussed the potential for complications and the possibility of necessary surgical intervention. You have been referred to general surgery for further evaluation.  -ESSENTIAL HYPERTENSION: Your hypertension is well-controlled with your current medications, losartan  and amlodipine . We will continue with these medications and monitor your blood pressure regularly.  -ELEVATED PSA READINGS: Continue your follow-up with urology for ongoing management.  -HISTORY OF CUTANEOUS SQUAMOUS CELL CARCINOMA: You have a history of squamous cell carcinoma on your leg, which was treated approximately ten years ago. It is important to have routine skin checks due to your history of skin cancer. You have been referred to dermatology for a routine skin check.  INSTRUCTIONS: Please have your fasting blood work done tomorrow morning at the [place] location. This will include a comprehensive metabolic panel (CMP), complete blood count (CBC), lipid panel, and A1c. Follow up with general surgery for the evaluation of your inguinal hernia and with dermatology for a routine skin check. Continue your current medications for hypertension and follow up with urology for BPH management.    Contains text  generated by [employer].   "

## 2024-10-20 NOTE — Progress Notes (Signed)
 "  Complete physical exam  Patient: Travis Bell   DOB: 1946/05/12   79 y.o. Male  MRN: [id number]  Subjective:    Chief Complaint  Patient presents with   Annual Exam   New Patient (Initial Visit)    Travis Bell is a 79 y.o. male who presents today for a complete physical exam. He does mention concern for bulge in right inguinal region.  Currently lives with: Wife. Acute concerns or interim problems since last visit: Ongoing left hip pain, sees sports medicine. Seeing urology for BPH and elevated PSA.  Vision concerns: Plans to see optometry. Dental concerns: Regular visits.  ETOH use: Socially. Nicotine use: None.  Recreational drugs/illegal substances: No.  Discussed the use of AI scribe software for clinical note transcription with the patient, who gave verbal consent to proceed.   Most recent fall risk assessment:    10/20/2024    9:53 AM  Fall Risk   Falls in the past year? 0  Number falls in past yr: 0  Injury with Fall? 0  Risk for fall due to : No Fall Risks  Follow up Falls evaluation completed     Most recent depression screenings:    10/20/2024    9:53 AM 10/14/2023    8:32 AM  PHQ 2/9 Scores  PHQ - 2 Score 0 0        Patient Care Team: Landy Honora LITTIE DEVONNA as PCP - General (Physician Assistant)   Show/hide medication list[1]  ROS See HPI, all else negative.        Objective:     BP 130/78   Pulse 82   Temp 97.8 F (36.6 C)   Resp 17   Ht 5' 9 (1.753 m)   Wt 153 lb 3.2 oz (69.5 kg)   SpO2 100%   BMI 22.62 kg/m    Physical Exam Constitutional:      Appearance: Normal appearance.  HENT:     Head: Normocephalic and atraumatic.     Right Ear: Tympanic membrane normal.     Left Ear: Tympanic membrane normal.     Mouth/Throat:     Pharynx: Oropharynx is clear.  Eyes:     Extraocular Movements: Extraocular movements intact.     Pupils: Pupils are equal, round, and reactive to light.     Comments: Possible pterygia  right eye, lateral aspect.   Cardiovascular:     Rate and Rhythm: Normal rate and regular rhythm.     Pulses: Normal pulses.     Heart sounds: Normal heart sounds.  Pulmonary:     Effort: Pulmonary effort is normal. No respiratory distress.     Breath sounds: Normal breath sounds.  Abdominal:     General: There is no distension.     Palpations: Abdomen is soft.     Tenderness: There is no abdominal tenderness.     Hernia: A hernia is present.     Comments: Soft, reducible right inguinal hernia.  Genitourinary:    Comments: Deferred Musculoskeletal:        General: Normal range of motion.     Cervical back: Normal range of motion.  Lymphadenopathy:     Cervical: No cervical adenopathy.  Skin:    General: Skin is warm.  Neurological:     General: No focal deficit present.     Mental Status: He is alert and oriented to person, place, and time.  Psychiatric:        Behavior: Behavior normal.  No results found for any visits on 10/20/24.   The 10-year ASCVD risk score (Arnett DK, et al., 2019) is: 27.4%   Values used to calculate the score:     Age: 34 years     Clinically relevant sex: Male     Is Non-Hispanic African American: No     Diabetic: No     Tobacco smoker: No     Systolic Blood Pressure: 130 mmHg     Is BP treated: Yes     HDL Cholesterol: 74.7 mg/dL     Total Cholesterol: 197 mg/dL     Assessment & Plan:    Routine Health Maintenance and Physical Exam Discussed health promotion and safety including diet and exercise recommendations, dental health, and injury prevention. Tobacco cessation if applicable. Seat belts, sunscreen, smoke detectors, etc.    Immunization History  Administered Date(s) Administered   Fluad Quad(high Dose 65+) 08/03/2019, 08/15/2020, 07/31/2022   INFLUENZA, HIGH DOSE SEASONAL PF 06/23/2017, 07/13/2018, 07/07/2024   Influenza Split 07/31/2012   Influenza-Unspecified 07/31/2013, 08/30/2014, 09/16/2015, 09/01/2021    PFIZER(Purple Top)SARS-COV-2 Vaccination 10/28/2019, 11/25/2019, 07/03/2020   Pneumococcal Conjugate-13 01/13/2014   Pneumococcal Polysaccharide-23 04/29/2016   Td 12/08/2009   Tdap 08/15/2020    Health Maintenance  Topic Date Due   Medicare Annual Wellness (AWV)  06/23/2018   COVID-19 Vaccine (4 - 2025-26 season) 05/31/2024   DTaP/Tdap/Td (3 - Td or Tdap) 08/15/2030   Pneumococcal Vaccine: 50+ Years  Completed   Influenza Vaccine  Completed   Hepatitis C Screening  Completed   Zoster Vaccines- Shingrix  Completed   Meningococcal B Vaccine  Aged Out   Colonoscopy  Discontinued   Fecal DNA (Cologuard)  Discontinued        Problem List Items Addressed This Visit       Cardiovascular and Mediastinum   HYPERTENSION, BENIGN ESSENTIAL   - Hypertension well-controlled with losartan  and amlodipine .  - Recent blood pressure reading slightly elevated, likely due to steroid injection and ibuprofen use. - Emphasized importance of continued at-home BP checks. They are typically 120s/80s mmHg.  - Refilled prescriptions for losartan  and amlodipine .      Relevant Medications   amLODipine  (NORVASC ) 10 MG tablet   losartan  (COZAAR ) 100 MG tablet   Other Relevant Orders   Comprehensive metabolic panel with GFR   CBC with Differential/Platelet   Lipid panel   Hemoglobin A1c     Musculoskeletal and Integument   SCC (squamous cell carcinoma)   - Squamous cell carcinoma on left leg removed in 2017. - Discussed importance of routine skin checks due to history of skin cancer. - Referred to dermatology for routine skin check.      Relevant Orders   Ambulatory referral to Dermatology   RESOLVED: Squamous cell carcinoma in situ (SCCIS) of left lower extremity   Denies any new lesions. Discussed  Recommend referral for skin checks.        Genitourinary   BPH without urinary obstruction - Primary   - Follows with urology for progressively elevated PSA last year.  - Last PSA check in  August. - BPH stable and he denies any significant symptoms.        Other   Right inguinal hernia   - Present for two months without considerable pain or discomfort.  - Cautioned on risk of ultimate incarceration.  - He is active and may benefit from surgical evaluation. - Referral placed to general surgery.      Relevant Orders   Ambulatory  referral to General Surgery    Assessment and Plan Assessment & Plan Annual physical examination and preventive care - BP overall well-controlled. - Patient prefers fasting labs as he ate earlier and even had steroid injection. - Ordered CMP, CBC, lipid panel, and A1c. - Scheduled labs at [facility] location for fasting blood work.   Recommend that most people either abstain from alcohol or drink within safe limits (<=14/week and <=4 drinks/occasion for males, <=7/weeks and <= 3 drinks/occasion for females) and that the risk for alcohol disorders and other health effects rises proportionally with the number of drinks per week and how often a drinker exceeds daily limits.    Diet: Recommend to adjust caloric intake to maintain or achieve ideal body weight, to reduce intake of dietary saturated fat and total fat, to limit sodium intake by avoiding high sodium foods and not adding table salt, and to maintain adequate dietary potassium and calcium preferably from fresh fruits, vegetables, and low-fat dairy products.   Emphasized the importance of regular exercise.  Injury prevention: Recommend seatbelts, safety helmets, smoke detector, etc..   Dental health: Recommend regular tooth brushing, flossing, and dental visits.     Return in about 1 year (around 10/20/2025) for Annual Physical Appt.     Honora Seip, PA-C    [1]  Outpatient Medications Prior to Visit  Medication Sig   Calcium Carbonate-Vitamin D  (CALCIUM + D PO) Take by mouth daily.   fish oil-omega-3 fatty acids 1000 MG capsule Take 2 g by mouth daily.   Multiple  Vitamins-Minerals (MULTIVITAMIN PO) Take by mouth daily.   Nutritional Supplements (JOINT FORMULA PO) Take by mouth daily.   traZODone  (DESYREL ) 50 MG tablet TAKE 0.5-1 TABLETS BY MOUTH AT BEDTIME AS NEEDED FOR SLEEP.   [DISCONTINUED] amLODipine  (NORVASC ) 10 MG tablet TAKE 1 TABLET BY MOUTH EVERY DAY   [DISCONTINUED] losartan  (COZAAR ) 100 MG tablet TAKE 1 TABLET BY MOUTH EVERY DAY   No facility-administered medications prior to visit.   "

## 2024-10-20 NOTE — Assessment & Plan Note (Addendum)
-   Squamous cell carcinoma on left leg removed in 2017. - Discussed importance of routine skin checks due to history of skin cancer. - Referred to dermatology for routine skin check.

## 2024-10-20 NOTE — Assessment & Plan Note (Signed)
 Denies any new lesions. Discussed  Recommend referral for skin checks.

## 2024-10-20 NOTE — Assessment & Plan Note (Addendum)
-   Hypertension well-controlled with losartan  and amlodipine .  - Recent blood pressure reading slightly elevated, likely due to steroid injection and ibuprofen use. - Emphasized importance of continued at-home BP checks. They are typically 120s/80s mmHg.  - Refilled prescriptions for losartan  and amlodipine .

## 2024-10-20 NOTE — Assessment & Plan Note (Addendum)
-   Follows with urology for progressively elevated PSA last year.  - Last PSA check in August. - BPH stable and he denies any significant symptoms.

## 2024-10-20 NOTE — Assessment & Plan Note (Addendum)
-   Present for two months without considerable pain or discomfort.  - Cautioned on risk of ultimate incarceration.  - He is active and may benefit from surgical evaluation. - Referral placed to general surgery.

## 2024-10-20 NOTE — Patient Instructions (Addendum)
 Thank you for coming in today.   You received an injection today. Seek immediate medical attention if the joint becomes red, extremely painful, or is oozing fluid.   If this shot doesn't work, let me know

## 2024-10-21 ENCOUNTER — Other Ambulatory Visit

## 2024-10-21 ENCOUNTER — Ambulatory Visit: Payer: Self-pay | Admitting: Physician Assistant

## 2024-10-21 DIAGNOSIS — I10 Essential (primary) hypertension: Secondary | ICD-10-CM

## 2024-10-21 LAB — CBC WITH DIFFERENTIAL/PLATELET
Basophils Absolute: 0 K/uL (ref 0.0–0.1)
Basophils Relative: 0.1 % (ref 0.0–3.0)
Eosinophils Absolute: 0 K/uL (ref 0.0–0.7)
Eosinophils Relative: 0 % (ref 0.0–5.0)
HCT: 40.3 % (ref 39.0–52.0)
Hemoglobin: 13.9 g/dL (ref 13.0–17.0)
Lymphocytes Relative: 16.1 % (ref 12.0–46.0)
Lymphs Abs: 1.2 K/uL (ref 0.7–4.0)
MCHC: 34.6 g/dL (ref 30.0–36.0)
MCV: 92.8 fl (ref 78.0–100.0)
Monocytes Absolute: 0.4 K/uL (ref 0.1–1.0)
Monocytes Relative: 5.5 % (ref 3.0–12.0)
Neutro Abs: 5.6 K/uL (ref 1.4–7.7)
Neutrophils Relative %: 78.3 % — ABNORMAL HIGH (ref 43.0–77.0)
Platelets: 303 K/uL (ref 150.0–400.0)
RBC: 4.34 Mil/uL (ref 4.22–5.81)
RDW: 13.1 % (ref 11.5–15.5)
WBC: 7.2 K/uL (ref 4.0–10.5)

## 2024-10-21 LAB — COMPREHENSIVE METABOLIC PANEL WITH GFR
ALT: 22 U/L (ref 3–53)
AST: 23 U/L (ref 5–37)
Albumin: 4.7 g/dL (ref 3.5–5.2)
Alkaline Phosphatase: 45 U/L (ref 39–117)
BUN: 23 mg/dL (ref 6–23)
CO2: 26 meq/L (ref 19–32)
Calcium: 9.9 mg/dL (ref 8.4–10.5)
Chloride: 100 meq/L (ref 96–112)
Creatinine, Ser: 0.81 mg/dL (ref 0.40–1.50)
GFR: 85.81 mL/min
Glucose, Bld: 118 mg/dL — ABNORMAL HIGH (ref 70–99)
Potassium: 4.1 meq/L (ref 3.5–5.1)
Sodium: 136 meq/L (ref 135–145)
Total Bilirubin: 0.7 mg/dL (ref 0.2–1.2)
Total Protein: 6.7 g/dL (ref 6.0–8.3)

## 2024-10-21 LAB — LIPID PANEL
Cholesterol: 207 mg/dL — ABNORMAL HIGH (ref 28–200)
HDL: 85.9 mg/dL
LDL Cholesterol: 109 mg/dL — ABNORMAL HIGH (ref 10–99)
NonHDL: 120.79
Total CHOL/HDL Ratio: 2
Triglycerides: 61 mg/dL (ref 10.0–149.0)
VLDL: 12.2 mg/dL (ref 0.0–40.0)

## 2024-10-21 LAB — HEMOGLOBIN A1C: Hgb A1c MFr Bld: 5.9 % (ref 4.6–6.5)

## 2024-10-21 NOTE — Addendum Note (Signed)
 Addended by: LANDY CONSOLE on: 10/21/2024 12:58 PM   Modules accepted: Level of Service

## 2024-12-21 ENCOUNTER — Ambulatory Visit: Admitting: Dermatology
